# Patient Record
Sex: Female | Born: 1990 | Race: Black or African American | Hispanic: No | Marital: Single | Smoking: Never smoker
Health system: Southern US, Community
[De-identification: ages and names within clinical notes are randomized; demographics above are authoritative.]

## PROBLEM LIST (undated history)

## (undated) DIAGNOSIS — D649 Anemia, unspecified: Secondary | ICD-10-CM

## (undated) HISTORY — PX: TONSILLECTOMY: SUR1361

## (undated) HISTORY — PX: HERNIA REPAIR: SHX51

---

## 2016-09-29 ENCOUNTER — Encounter (HOSPITAL_COMMUNITY): Payer: Self-pay | Admitting: Emergency Medicine

## 2016-09-29 ENCOUNTER — Emergency Department (HOSPITAL_COMMUNITY)
Admission: EM | Admit: 2016-09-29 | Discharge: 2016-09-29 | Disposition: A | Discharge status: Home / Self Care | Payer: Self-pay | Attending: Emergency Medicine | Admitting: Emergency Medicine

## 2016-09-29 ENCOUNTER — Other Ambulatory Visit: Payer: Self-pay

## 2016-09-29 ENCOUNTER — Emergency Department (HOSPITAL_COMMUNITY): Payer: Self-pay

## 2016-09-29 DIAGNOSIS — Z9109 Other allergy status, other than to drugs and biological substances: Secondary | ICD-10-CM

## 2016-09-29 DIAGNOSIS — R0789 Other chest pain: Secondary | ICD-10-CM | POA: Insufficient documentation

## 2016-09-29 LAB — BASIC METABOLIC PANEL
ANION GAP: 8 (ref 5–15)
BUN: 10 mg/dL (ref 6–20)
CO2: 25 mmol/L (ref 22–32)
Calcium: 9.4 mg/dL (ref 8.9–10.3)
Chloride: 106 mmol/L (ref 101–111)
Creatinine, Ser: 0.7 mg/dL (ref 0.44–1.00)
GLUCOSE: 102 mg/dL — AB (ref 65–99)
POTASSIUM: 3.8 mmol/L (ref 3.5–5.1)
Sodium: 139 mmol/L (ref 135–145)

## 2016-09-29 LAB — CBC
HEMATOCRIT: 38 % (ref 36.0–46.0)
HEMOGLOBIN: 12.8 g/dL (ref 12.0–15.0)
MCH: 27.3 pg (ref 26.0–34.0)
MCHC: 33.7 g/dL (ref 30.0–36.0)
MCV: 81 fL (ref 78.0–100.0)
Platelets: 349 10*3/uL (ref 150–400)
RBC: 4.69 MIL/uL (ref 3.87–5.11)
RDW: 13 % (ref 11.5–15.5)
WBC: 7.2 10*3/uL (ref 4.0–10.5)

## 2016-09-29 LAB — POCT I-STAT TROPONIN I: Troponin i, poc: 0 ng/mL (ref 0.00–0.08)

## 2016-09-29 MED ORDER — ALBUTEROL SULFATE HFA 108 (90 BASE) MCG/ACT IN AERS: 1.0000 | INHALATION_SPRAY | Freq: Four times a day (QID) | RESPIRATORY_TRACT | 0 refills | Status: DC | PRN

## 2016-09-29 MED ORDER — ALBUTEROL SULFATE HFA 108 (90 BASE) MCG/ACT IN AERS
2.0000 | INHALATION_SPRAY | Freq: Once | RESPIRATORY_TRACT | Status: AC
  Administered 2016-09-29: 2 via RESPIRATORY_TRACT
  Filled 2016-09-29: qty 6.7

## 2016-09-29 MED ORDER — CETIRIZINE HCL 10 MG PO TABS: 10.0000 mg | ORAL_TABLET | Freq: Every day | ORAL | 0 refills | Status: DC

## 2016-09-29 NOTE — Discharge Instructions (Signed)
Continue to stay well-hydrated. Alternate between Tylenol and Ibuprofen for pain or fever. Use inhaler as directed, as needed for cough/chest congestion/wheezing/shortness of breath. Use Mucinex for cough suppression/expectoration of mucus. Use netipot and flonase to help with nasal congestion. Take the antihistamine zyrtec to decrease secretions and for help with your symptoms. Follow up with your primary care doctor in 5-7 days for recheck of ongoing symptoms. Return to emergency department for emergent changing or worsening of symptoms.

## 2016-09-29 NOTE — ED Triage Notes (Signed)
Pt from home with central cp x 1 week. Pt states it feels like a pressure. Pt states she has family hx of asthma and is worried she has the same. Pt has clear bilateral lung sounds, regular heart sounds, and strong bilateral radial pulses

## 2016-09-29 NOTE — ED Provider Notes (Signed)
[facility]-EMERGENCY DEPT Provider Note   CSN: [id number] Arrival date & time: 09/29/16  1755     History   Chief Complaint Chief Complaint  Patient presents with  . Chest Pain    HPI Carla Walker is a 26 y.o. female with no PMHx, who moved here from [place] 4 months ago, who presents to the ED with complaints of chest tightness that has been intermittent 2 weeks. She states that it's not really a pain but rather a discomfort, describing it as 7/10 intermittent nonradiating central chest tightness, with no known aggravating factors, unchanged with exertion or inspiration, and with no treatments tried or alleviating factors noted. She mentions that she's had some dental pain over the last 1 month so she has been taking NSAIDs more frequently but had not associated that with her chest tightness. She also mentions that last month she had some mild rhinorrhea and a dry cough that resolved and she didn't think anything of it. She was worried that because of her strong family history of asthma, she perhaps also had asthma and "didn't want it to sneak up on her". She just moved here from [place] 4 months ago.  She denies diaphoresis, lightheadedness, ongoing rhinorrhea, sore throat, ear pain/drainage, fevers, chills, ongoing cough, chest pain, SOB, LE swelling, recent travel/surgery/immobilization, estrogen use, personal hx of DVT/PE, abd pain, N/V/D/C, hematuria, dysuria, myalgias, arthralgias, claudication, orthopnea, numbness, tingling, focal weakness, or any other complaints at this time. She is a Non-Smoker. No known FHx of cardiac disease, however she states her mother died of a DVT that became a PE and she suddenly died when pt was 26y/o; she can't remember any specifics of the event, but states she had fallen a few months prior, no known fx, but that the next thing that happened was she died. No other family members with hx of DVT.    The history is provided by the patient and medical records. No  language interpreter was used.  Chest Pain   This is a new problem. The current episode started more than 1 week ago. The problem occurs daily. The problem has not changed since onset.The pain is associated with rest. The pain is present in the substernal region. The pain is at a severity of 7/10. The pain is mild (discomfort rather than pain, just a tightness discomfort feeling). Quality: tightness. The pain does not radiate. Duration of episode(s) is 2 weeks. Exacerbated by: nothing. Pertinent negatives include no abdominal pain, no claudication, no cough (dry cough last month, none ongoing), no diaphoresis, no fever, no leg pain, no lower extremity edema, no nausea, no numbness, no orthopnea, no shortness of breath, no vomiting and no weakness. She has tried nothing for the symptoms. The treatment provided no relief.  Her family medical history is significant for PE.  Pertinent negatives for family medical history include: no CAD and no early MI.    History reviewed. No pertinent past medical history.  There are no active problems to display for this patient.   Past Surgical History:  Procedure Laterality Date  . TONSILLECTOMY      OB History    No data available       Home Medications    Prior to Admission medications   Medication Sig Start Date End Date Taking? Authorizing Provider  ibuprofen (ADVIL,MOTRIN) 200 MG tablet Take 200-600 mg by mouth every 6 (six) hours as needed (tooth ache).   Yes [provider]  naproxen sodium (ANAPROX) 220 MG tablet Take  440 mg by mouth 2 (two) times daily as needed (toothache).   Yes [provider]    Family History No family history on file.  Social History Social History  Substance Use Topics  . Smoking status: Never Smoker  . Smokeless tobacco: Never Used  . Alcohol use Yes     Comment: occasionally      Allergies   Patient has no known allergies.   Review of Systems Review of Systems  Constitutional:  Negative for chills, diaphoresis and fever.  HENT: Negative for ear discharge, ear pain, rhinorrhea (last month, none ongoing) and sore throat.   Respiratory: Positive for chest tightness. Negative for cough (dry cough last month, none ongoing), shortness of breath and wheezing.   Cardiovascular: Negative for chest pain (tightness, but not really pain), orthopnea, claudication and leg swelling.  Gastrointestinal: Negative for abdominal pain, constipation, diarrhea, nausea and vomiting.  Genitourinary: Negative for dysuria and hematuria.  Musculoskeletal: Negative for arthralgias and myalgias.  Skin: Negative for color change.  Allergic/Immunologic: Negative for immunocompromised state.  Neurological: Negative for weakness, light-headedness and numbness.  Psychiatric/Behavioral: Negative for confusion.   All other systems reviewed and are negative for acute change except as noted in the HPI.    Physical Exam Updated Vital Signs BP 126/87 (BP Location: Right Arm)   Pulse 72   Temp 97.8 F (36.6 C) (Oral)   Resp 18   LMP 09/19/2016   SpO2 100%   Physical Exam  Constitutional: She is oriented to person, place, and time. Vital signs are normal. She appears well-developed and well-nourished.  Non-toxic appearance. No distress.  Afebrile, nontoxic, NAD  HENT:  Head: Normocephalic and atraumatic.  Mouth/Throat: Oropharynx is clear and moist and mucous membranes are normal.  Eyes: Conjunctivae and EOM are normal. Right eye exhibits no discharge. Left eye exhibits no discharge.  Neck: Normal range of motion. Neck supple.  Cardiovascular: Normal rate, regular rhythm, normal heart sounds and intact distal pulses.  Exam reveals no gallop and no friction rub.   No murmur heard. RRR, nl s1/s2, no m/r/g, distal pulses intact, no pedal edema   Pulmonary/Chest: Effort normal and breath sounds normal. No respiratory distress. She has no decreased breath sounds. She has no wheezes. She has no rhonchi.  She has no rales. She exhibits no tenderness, no crepitus, no deformity and no retraction.  CTAB in all lung fields, no w/r/r, no hypoxia or increased WOB, speaking in full sentences, SpO2 100% on RA Chest wall nonTTP without crepitus, deformities, or retractions   Abdominal: Soft. Normal appearance and bowel sounds are normal. She exhibits no distension. There is no tenderness. There is no rigidity, no rebound, no guarding, no CVA tenderness, no tenderness at McBurney's point and negative Murphy's sign.  Musculoskeletal: Normal range of motion.  MAE x4 Strength and sensation grossly intact in all extremities Distal pulses intact Gait steady No pedal edema, neg homan's bilaterally  Neurological: She is alert and oriented to person, place, and time. She has normal strength. No sensory deficit.  Skin: Skin is warm, dry and intact. No rash noted.  Psychiatric: She has a normal mood and affect.  Nursing note and vitals reviewed.    ED Treatments / Results  Labs (all labs ordered are listed, but only abnormal results are displayed) Labs Reviewed  BASIC METABOLIC PANEL - Abnormal; Notable for the following:       Result Value   Glucose, Bld 102 (*)    All other components within normal  limits  CBC  I-STAT TROPONIN, ED  POCT I-STAT TROPONIN I    EKG  EKG Interpretation None       Radiology Dg Chest 2 View  Result Date: 09/29/2016 CLINICAL DATA:  Chest pain EXAM: CHEST  2 VIEW COMPARISON:  None. FINDINGS: Lungs are clear. Heart size and pulmonary vascularity are normal. No adenopathy. No pneumothorax. No bone lesions. IMPRESSION: No abnormality noted. Electronically Signed   By: Bretta BangWilliam  Woodruff III M.D.   On: 09/29/2016 18:54    Procedures Procedures (including critical care time)  Medications Ordered in ED Medications  albuterol (PROVENTIL HFA;VENTOLIN HFA) 108 (90 Base) MCG/ACT inhaler 2 puff (not administered)     Initial Impression / Assessment and Plan / ED Course    I have reviewed the triage vital signs and the nursing notes.  Pertinent labs & imaging results that were available during my care of the patient were reviewed by me and considered in my medical decision making (see chart for details).     26 y.o. female here with chest tightness x2wks which is intermittent, states it's not really a pain but more of a discomfort. Denies SOB or any other symptoms. Mentions that last month she had a dry cough and mild rhinorrhea for a brief period of time, but that resolved. On exam, clear lungs, no tachycardia or hypoxia, no LE swelling, no reproducible chest tenderness. Highly doubt PE, dissection, ACS, etc. Labs unremarkable, CXR neg, EKG nonischemic and without acute findings. Seems like it could potentially be mild asthma/allergies, since she just moved here a few months ago, and has +FHx of asthma. Will start on zyrtec, give inhaler, advised other remedies to help with symptoms, and advised f/up with her PCP in 1wk for recheck of symptoms. I explained the diagnosis and have given explicit precautions to return to the ER including for any other new or worsening symptoms. The patient understands and accepts the medical plan as it's been dictated and I have answered their questions. Discharge instructions concerning home care and prescriptions have been given. The patient is STABLE and is discharged to home in good condition.    Final Clinical Impressions(s) / ED Diagnoses   Final diagnoses:  Chest tightness  Environmental allergies    New Prescriptions New Prescriptions   ALBUTEROL (PROVENTIL HFA;VENTOLIN HFA) 108 (90 BASE) MCG/ACT INHALER    Inhale 1-2 puffs into the lungs every 6 (six) hours as needed for wheezing or shortness of breath.   CETIRIZINE (ZYRTEC ALLERGY) 10 MG TABLET    Take 1 tablet (10 mg total) by mouth daily.     [address], LynnvilleMercedes, [place]-C 09/29/16 2228    Alvira MondaySchlossman, Erin, MD 09/30/16 2241

## 2017-03-22 ENCOUNTER — Other Ambulatory Visit: Payer: Self-pay

## 2017-03-22 ENCOUNTER — Emergency Department (HOSPITAL_COMMUNITY): Payer: Self-pay

## 2017-03-22 DIAGNOSIS — Z79899 Other long term (current) drug therapy: Secondary | ICD-10-CM | POA: Insufficient documentation

## 2017-03-22 DIAGNOSIS — R0789 Other chest pain: Secondary | ICD-10-CM | POA: Insufficient documentation

## 2017-03-22 LAB — CBC
HCT: 37.2 % (ref 36.0–46.0)
Hemoglobin: 12.6 g/dL (ref 12.0–15.0)
MCH: 27 pg (ref 26.0–34.0)
MCHC: 33.9 g/dL (ref 30.0–36.0)
MCV: 79.7 fL (ref 78.0–100.0)
PLATELETS: 364 10*3/uL (ref 150–400)
RBC: 4.67 MIL/uL (ref 3.87–5.11)
RDW: 13 % (ref 11.5–15.5)
WBC: 7.2 10*3/uL (ref 4.0–10.5)

## 2017-03-22 LAB — BASIC METABOLIC PANEL
Anion gap: 7 (ref 5–15)
BUN: 9 mg/dL (ref 6–20)
CALCIUM: 9.3 mg/dL (ref 8.9–10.3)
CHLORIDE: 106 mmol/L (ref 101–111)
CO2: 24 mmol/L (ref 22–32)
CREATININE: 0.61 mg/dL (ref 0.44–1.00)
Glucose, Bld: 111 mg/dL — ABNORMAL HIGH (ref 65–99)
Potassium: 3.8 mmol/L (ref 3.5–5.1)
SODIUM: 137 mmol/L (ref 135–145)

## 2017-03-22 LAB — I-STAT TROPONIN, ED: TROPONIN I, POC: 0 ng/mL (ref 0.00–0.08)

## 2017-03-22 LAB — I-STAT BETA HCG BLOOD, ED (MC, WL, AP ONLY)

## 2017-03-22 NOTE — ED Triage Notes (Signed)
Pt reports intermittent upper cp x 1 week with intermittent numbness in her arms and legs bilaterally.  She reports she's been seen in the past for the cp with unknown cause.  She states she was given an inhaler, but she is not having any diff breathing.  Pt is A&Ox 4.

## 2017-03-23 ENCOUNTER — Emergency Department (HOSPITAL_COMMUNITY)
Admission: EM | Admit: 2017-03-23 | Discharge: 2017-03-23 | Disposition: A | Discharge status: Home / Self Care | Payer: Self-pay | Attending: Emergency Medicine | Admitting: Emergency Medicine

## 2017-03-23 DIAGNOSIS — R0789 Other chest pain: Secondary | ICD-10-CM

## 2017-03-23 MED ORDER — METHOCARBAMOL 500 MG PO TABS: 500.0000 mg | ORAL_TABLET | Freq: Two times a day (BID) | ORAL | 0 refills | Status: DC

## 2017-03-23 MED ORDER — DICLOFENAC SODIUM 75 MG PO TBEC: 75.0000 mg | DELAYED_RELEASE_TABLET | Freq: Two times a day (BID) | ORAL | 0 refills | Status: DC

## 2017-03-23 NOTE — ED Provider Notes (Signed)
[facility]-EMERGENCY DEPT Provider Note   CSN: [id number] Arrival date & time: 03/22/17  1928     History   Chief Complaint Chief Complaint  Patient presents with  . Chest Pain  . Numbness    HPI Carla Walker is a 27 y.o. female.  The history is provided by the patient. No language interpreter was used.  Chest Pain   This is a new problem. The current episode started more than 1 week ago. The problem occurs constantly. The problem has been gradually worsening. The pain is associated with movement. The pain is present in the substernal region. The pain is moderate. The quality of the pain is described as brief. The pain does not radiate. She has tried nothing for the symptoms. The treatment provided no relief. There are no known risk factors.  Pertinent negatives for family medical history include: no early MI.    No past medical history on file.  There are no active problems to display for this patient.   Past Surgical History:  Procedure Laterality Date  . TONSILLECTOMY      OB History    No data available       Home Medications    Prior to Admission medications   Medication Sig Start Date End Date Taking? Authorizing Provider  albuterol (PROVENTIL HFA;VENTOLIN HFA) 108 (90 Base) MCG/ACT inhaler Inhale 1-2 puffs into the lungs every 6 (six) hours as needed for wheezing or shortness of breath. 09/29/16   Street, [place], PA-C  cetirizine (ZYRTEC ALLERGY) 10 MG tablet Take 1 tablet (10 mg total) by mouth daily. 09/29/16   Street, [place], PA-C  diclofenac (VOLTAREN) 75 MG EC tablet Take 1 tablet (75 mg total) by mouth 2 (two) times daily. 03/23/17   Elson Areas, PA-C  ibuprofen (ADVIL,MOTRIN) 200 MG tablet Take 200-600 mg by mouth every 6 (six) hours as needed (tooth ache).    [provider]  methocarbamol (ROBAXIN) 500 MG tablet Take 1 tablet (500 mg total) by mouth 2 (two) times daily. 03/23/17   Elson Areas, PA-C  naproxen  sodium (ANAPROX) 220 MG tablet Take 440 mg by mouth 2 (two) times daily as needed (toothache).    [provider]    Family History No family history on file.  Social History Social History   Tobacco Use  . Smoking status: Never Smoker  . Smokeless tobacco: Never Used  Substance Use Topics  . Alcohol use: Yes    Comment: occasionally   . Drug use: No     Allergies   Patient has no known allergies.   Review of Systems Review of Systems  Cardiovascular: Positive for chest pain.  All other systems reviewed and are negative.    Physical Exam Updated Vital Signs BP 100/67 (BP Location: Left Arm)   Pulse 72   Temp 98.4 F (36.9 C) (Oral)   Resp 20   Ht 5\' 7"  (1.702 m)   Wt 100.7 kg (222 lb)   LMP 03/02/2017   SpO2 100%   BMI 34.77 kg/m   Physical Exam  Constitutional: She appears well-developed and well-nourished. No distress.  HENT:  Head: Normocephalic and atraumatic.  Eyes: Conjunctivae are normal.  Neck: Neck supple.  Cardiovascular: Normal rate, regular rhythm and normal pulses.  No murmur heard. Pulmonary/Chest: Effort normal and breath sounds normal. No respiratory distress. She has no decreased breath sounds.  Abdominal: Soft. There is no tenderness.  Musculoskeletal: She exhibits no edema.  Neurological: She is  alert.  Skin: Skin is warm and dry.  Psychiatric: She has a normal mood and affect.  Nursing note and vitals reviewed.    ED Treatments / Results  Labs (all labs ordered are listed, but only abnormal results are displayed) Labs Reviewed  BASIC METABOLIC PANEL - Abnormal; Notable for the following components:      Result Value   Glucose, Bld 111 (*)    All other components within normal limits  CBC  I-STAT TROPONIN, ED  I-STAT BETA HCG BLOOD, ED ([facility], [facility], [facility] ONLY)    EKG  EKG Interpretation None       Radiology Dg Chest 2 View  Result Date: 03/22/2017 CLINICAL DATA:  Intermittent chest pain for 1 week EXAM: CHEST  2  VIEW COMPARISON:  09/29/2016 FINDINGS: Normal heart size and mediastinal contours. No acute infiltrate or edema. No effusion or pneumothorax. No acute osseous findings. IMPRESSION: Negative chest. Electronically Signed   By: Marnee SpringJonathon  Watts M.D.   On: 03/22/2017 20:42    Procedures Procedures (including critical care time)  Medications Ordered in ED Medications - No data to display   Initial Impression / Assessment and Plan / ED Course  I have reviewed the triage vital signs and the nursing notes.  Pertinent labs & imaging results that were available during my care of the patient were reviewed by me and considered in my medical decision making (see chart for details).     EKG normal, chest xray normal, labs reviewed.   Final Clinical Impressions(s) / ED Diagnoses   Final diagnoses:  Chest wall pain    ED Discharge Orders        Ordered    methocarbamol (ROBAXIN) 500 MG tablet  2 times daily     03/23/17 0910    diclofenac (VOLTAREN) 75 MG EC tablet  2 times daily     03/23/17 0910    An After Visit Summary was printed and given to the patient.    Elson AreasSofia, Raizel Wesolowski K, [place]PA-C 03/23/17 1125    Derwood KaplanNanavati, Ankit, MD 03/23/17 1719

## 2017-03-23 NOTE — Discharge Instructions (Signed)
Return if any problems.

## 2017-03-23 NOTE — ED Notes (Signed)
PA student at bedside.

## 2017-03-23 NOTE — ED Notes (Signed)
ED Provider at bedside. 

## 2017-10-04 LAB — OB RESULTS CONSOLE ANTIBODY SCREEN: Antibody Screen: NEGATIVE

## 2017-10-04 LAB — OB RESULTS CONSOLE GC/CHLAMYDIA
Chlamydia: NEGATIVE
Gonorrhea: NEGATIVE

## 2017-10-04 LAB — OB RESULTS CONSOLE ABO/RH: RH Type: POSITIVE

## 2017-10-04 LAB — OB RESULTS CONSOLE RPR: RPR: NONREACTIVE

## 2017-10-04 LAB — OB RESULTS CONSOLE HIV ANTIBODY (ROUTINE TESTING): HIV: NONREACTIVE

## 2017-10-04 LAB — OB RESULTS CONSOLE HEPATITIS B SURFACE ANTIGEN: Hepatitis B Surface Ag: NEGATIVE

## 2017-10-04 LAB — OB RESULTS CONSOLE RUBELLA ANTIBODY, IGM: RUBELLA: IMMUNE

## 2018-01-06 ENCOUNTER — Other Ambulatory Visit: Payer: Self-pay | Admitting: Obstetrics and Gynecology

## 2018-02-08 ENCOUNTER — Encounter (HOSPITAL_COMMUNITY): Payer: Self-pay | Admitting: *Deleted

## 2018-02-08 ENCOUNTER — Inpatient Hospital Stay (HOSPITAL_COMMUNITY)
Admission: AD | Admit: 2018-02-08 | Discharge: 2018-02-08 | Disposition: A | Discharge status: Home / Self Care | Payer: Medicaid Other | Source: Ambulatory Visit | Attending: Obstetrics and Gynecology | Admitting: Obstetrics and Gynecology

## 2018-02-08 ENCOUNTER — Other Ambulatory Visit: Payer: Self-pay

## 2018-02-08 DIAGNOSIS — O479 False labor, unspecified: Secondary | ICD-10-CM

## 2018-02-08 DIAGNOSIS — Z79899 Other long term (current) drug therapy: Secondary | ICD-10-CM | POA: Insufficient documentation

## 2018-02-08 DIAGNOSIS — Z3A35 35 weeks gestation of pregnancy: Secondary | ICD-10-CM | POA: Insufficient documentation

## 2018-02-08 DIAGNOSIS — O26893 Other specified pregnancy related conditions, third trimester: Secondary | ICD-10-CM | POA: Diagnosis not present

## 2018-02-08 DIAGNOSIS — O4703 False labor before 37 completed weeks of gestation, third trimester: Secondary | ICD-10-CM | POA: Diagnosis not present

## 2018-02-08 DIAGNOSIS — O4700 False labor before 37 completed weeks of gestation, unspecified trimester: Secondary | ICD-10-CM

## 2018-02-08 LAB — URINALYSIS, ROUTINE W REFLEX MICROSCOPIC
BILIRUBIN URINE: NEGATIVE
GLUCOSE, UA: NEGATIVE mg/dL
HGB URINE DIPSTICK: NEGATIVE
Ketones, ur: NEGATIVE mg/dL
Leukocytes, UA: NEGATIVE
Nitrite: NEGATIVE
Protein, ur: NEGATIVE mg/dL
SPECIFIC GRAVITY, URINE: 1.014 (ref 1.005–1.030)
pH: 6 (ref 5.0–8.0)

## 2018-02-08 MED ORDER — NIFEDIPINE 10 MG PO CAPS
10.0000 mg | ORAL_CAPSULE | ORAL | Status: AC | PRN
  Administered 2018-02-08 (×3): 10 mg via ORAL
  Filled 2018-02-08 (×3): qty 1

## 2018-02-08 MED ORDER — LACTATED RINGERS IV BOLUS
1000.0000 mL | Freq: Once | INTRAVENOUS | Status: AC
  Administered 2018-02-08: 1000 mL via INTRAVENOUS

## 2018-02-08 MED ORDER — TERBUTALINE SULFATE 1 MG/ML IJ SOLN
0.2500 mg | Freq: Once | INTRAMUSCULAR | Status: AC
  Administered 2018-02-08: 0.25 mg via SUBCUTANEOUS
  Filled 2018-02-08: qty 1

## 2018-02-08 MED ORDER — NIFEDIPINE 10 MG PO CAPS: 10.0000 mg | ORAL_CAPSULE | Freq: Four times a day (QID) | ORAL | 0 refills | Status: DC | PRN

## 2018-02-08 MED ORDER — ACETAMINOPHEN 500 MG PO TABS
1000.0000 mg | ORAL_TABLET | Freq: Once | ORAL | Status: AC
  Administered 2018-02-08: 1000 mg via ORAL
  Filled 2018-02-08: qty 2

## 2018-02-08 NOTE — Discharge Instructions (Signed)
Braxton Hicks Contractions °Contractions of the uterus can occur throughout pregnancy, but they are not always a sign that you are in labor. You may have practice contractions called Braxton Hicks contractions. These false labor contractions are sometimes confused with true labor. °What are Braxton Hicks contractions? °Braxton Hicks contractions are tightening movements that occur in the muscles of the uterus before labor. Unlike true labor contractions, these contractions do not result in opening (dilation) and thinning of the cervix. Toward the end of pregnancy (32-34 weeks), Braxton Hicks contractions can happen more often and may become stronger. These contractions are sometimes difficult to tell apart from true labor because they can be very uncomfortable. You should not feel embarrassed if you go to the hospital with false labor. °Sometimes, the only way to tell if you are in true labor is for your health care provider to look for changes in the cervix. The health care provider will do a physical exam and may monitor your contractions. If you are not in true labor, the exam should show that your cervix is not dilating and your water has not broken. °If there are other health problems associated with your pregnancy, it is completely safe for you to be sent home with false labor. You may continue to have Braxton Hicks contractions until you go into true labor. °How to tell the difference between true labor and false labor °True labor °· Contractions last 30-70 seconds. °· Contractions become very regular. °· Discomfort is usually felt in the top of the uterus, and it spreads to the lower abdomen and low back. °· Contractions do not go away with walking. °· Contractions usually become more intense and increase in frequency. °· The cervix dilates and gets thinner. °False labor °· Contractions are usually shorter and not as strong as true labor contractions. °· Contractions are usually irregular. °· Contractions  are often felt in the front of the lower abdomen and in the groin. °· Contractions may go away when you walk around or change positions while lying down. °· Contractions get weaker and are shorter-lasting as time goes on. °· The cervix usually does not dilate or become thin. °Follow these instructions at home: °· Take over-the-counter and prescription medicines only as told by your health care provider. °· Keep up with your usual exercises and follow other instructions from your health care provider. °· Eat and drink lightly if you think you are going into labor. °· If Braxton Hicks contractions are making you uncomfortable: °? Change your position from lying down or resting to walking, or change from walking to resting. °? Sit and rest in a tub of warm water. °? Drink enough fluid to keep your urine pale yellow. Dehydration may cause these contractions. °? Do slow and deep breathing several times an hour. °· Keep all follow-up prenatal visits as told by your health care provider. This is important. °Contact a health care provider if: °· You have a fever. °· You have continuous pain in your abdomen. °Get help right away if: °· Your contractions become stronger, more regular, and closer together. °· You have fluid leaking or gushing from your vagina. °· You pass blood-tinged mucus (bloody show). °· You have bleeding from your vagina. °· You have low back pain that you never had before. °· You feel your baby’s head pushing down and causing pelvic pressure. °· Your baby is not moving inside you as much as it used to. °Summary °· Contractions that occur before labor are called Braxton   Hicks contractions, false labor, or practice contractions. °· Braxton Hicks contractions are usually shorter, weaker, farther apart, and less regular than true labor contractions. True labor contractions usually become progressively stronger and regular and they become more frequent. °· Manage discomfort from Braxton Hicks contractions by  changing position, resting in a warm bath, drinking plenty of water, or practicing deep breathing. °This information is not intended to replace advice given to you by your health care provider. Make sure you discuss any questions you have with your health care provider. °Document Released: 07/15/2016 Document Revised: 07/15/2016 Document Reviewed: 07/15/2016 °Elsevier Interactive Patient Education © 2018 [employer] ° °

## 2018-02-08 NOTE — MAU Provider Note (Signed)
History     CSN: [id number]  Arrival date and time: 02/08/18 1356   First Provider Initiated Contact with Patient 02/08/18 1427      Chief Complaint  Patient presents with  . Abdominal Pain  . Back Pain   HPI Carla Walker is a 27 y.o. [id number] at 6443w2d who presents with contractions that started at 0600. She rates them a 5/10 and states they are gradually getting worse. She states they are every few minutes but is not timing them. She denies any leaking or bleeding. Reports normal fetal movement.   OB History    Gravida  5   Para  2   Term  2   Preterm      AB  2   Living  2     SAB      TAB  2   Ectopic      Multiple      Live Births  2           No past medical history on file.  Past Surgical History:  Procedure Laterality Date  . TONSILLECTOMY      No family history on file.  Social History   Tobacco Use  . Smoking status: Never Smoker  . Smokeless tobacco: Never Used  Substance Use Topics  . Alcohol use: Yes    Comment: occasionally   . Drug use: No    Allergies: No Known Allergies  Medications Prior to Admission  Medication Sig Dispense Refill Last Dose  . albuterol (PROVENTIL HFA;VENTOLIN HFA) 108 (90 Base) MCG/ACT inhaler Inhale 1-2 puffs into the lungs every 6 (six) hours as needed for wheezing or shortness of breath. 1 Inhaler 0   . cetirizine (ZYRTEC ALLERGY) 10 MG tablet Take 1 tablet (10 mg total) by mouth daily. 30 tablet 0   . diclofenac (VOLTAREN) 75 MG EC tablet Take 1 tablet (75 mg total) by mouth 2 (two) times daily. 20 tablet 0   . ibuprofen (ADVIL,MOTRIN) 200 MG tablet Take 200-600 mg by mouth every 6 (six) hours as needed (tooth ache).   Past Week at Unknown time  . methocarbamol (ROBAXIN) 500 MG tablet Take 1 tablet (500 mg total) by mouth 2 (two) times daily. 20 tablet 0   . naproxen sodium (ANAPROX) 220 MG tablet Take 440 mg by mouth 2 (two) times daily as needed (toothache).   Past Week at Unknown time     Review of Systems  Constitutional: Negative.  Negative for fatigue and fever.  HENT: Negative.   Respiratory: Negative.  Negative for shortness of breath.   Cardiovascular: Negative.  Negative for chest pain.  Gastrointestinal: Positive for abdominal pain. Negative for constipation, diarrhea, nausea and vomiting.  Genitourinary: Negative.  Negative for dysuria, vaginal bleeding and vaginal discharge.  Musculoskeletal: Positive for back pain.  Neurological: Negative.  Negative for dizziness and headaches.   Physical Exam   Blood pressure 129/75, pulse 98, temperature 98.5 F (36.9 C), temperature source Oral, resp. rate 18, height 5\' 7"  (1.702 m), weight 111.1 kg, last menstrual period 03/02/2017, SpO2 100 %.  Physical Exam  Nursing note and vitals reviewed. Constitutional: She is oriented to person, place, and time. She appears well-developed and well-nourished. No distress.  HENT:  Head: Normocephalic.  Eyes: Pupils are equal, round, and reactive to light.  Cardiovascular: Normal rate, regular rhythm and normal heart sounds.  Respiratory: Effort normal and breath sounds normal. No respiratory distress.  GI: Soft. Bowel sounds are normal. She exhibits no  distension. There is no tenderness.  Neurological: She is alert and oriented to person, place, and time.  Skin: Skin is warm and dry.  Psychiatric: She has a normal mood and affect. Her behavior is normal. Judgment and thought content normal.   Dilation: Closed Effacement (%): Thick Cervical Position: Posterior Exam by:: C Neill CNM  Fetal Tracing:  Baseline: 150 Variability: moderate Accels: 15x15 Decels: none  Toco: occasional uc's  MAU Course  Procedures Results for orders placed or performed during the hospital encounter of 02/08/18 (from the past 24 hour(s))  Urinalysis, Routine w reflex microscopic     Status: Abnormal   Collection Time: 02/08/18  2:18 PM  Result Value Ref Range   Color, Urine YELLOW YELLOW    APPearance CLOUDY (A) CLEAR   Specific Gravity, Urine 1.014 1.005 - 1.030   pH 6.0 5.0 - 8.0   Glucose, UA NEGATIVE NEGATIVE mg/dL   Hgb urine dipstick NEGATIVE NEGATIVE   Bilirubin Urine NEGATIVE NEGATIVE   Ketones, ur NEGATIVE NEGATIVE mg/dL   Protein, ur NEGATIVE NEGATIVE mg/dL   Nitrite NEGATIVE NEGATIVE   Leukocytes, UA NEGATIVE NEGATIVE   MDM UA Initially contracting every 4-5 minutes LR bolus  Contractions resolved after fluids and patient was able to sleep but the contractions returned. No change in cervix after 1.5 hours  LR infusion Procardia 10mg  PO q39min x3 doses  Patient reports the contractions do not feel as strong but continues to have contractions every 10 minutes  Consulted with Dr. Emelda Fear- will give patient terbutaline and MD to come see patient.   Dr. Emelda Fear at bedside and performed full physical exam. Uterus non tender, patient in no distress and reports contractions are better. Plan to discharge patient home with procardia and follow up in the office as scheduled. Patient agreeable to plan of care.  Assessment and Plan   1. Preterm contractions   2. [redacted] weeks gestation of pregnancy    -Discharge home in stable condition -Rx for procardia given to patient -Preterm labor precautions discussed -Patient advised to follow-up with [facility] as scheduled for prenatal care -Patient may return to MAU as needed or if her condition were to change or worsen  Rolm Bookbinder CNM 02/08/2018, 5:22 PM

## 2018-02-08 NOTE — MAU Note (Signed)
Woke up this morning with cramps, getting worse.  They are in the back and in her stomach.

## 2018-02-22 ENCOUNTER — Encounter (HOSPITAL_COMMUNITY): Payer: Self-pay

## 2018-03-06 ENCOUNTER — Encounter (HOSPITAL_COMMUNITY)
Admission: RE | Admit: 2018-03-06 | Discharge: 2018-03-06 | Disposition: A | Discharge status: Home / Self Care | Payer: Medicaid Other | Source: Ambulatory Visit | Attending: Obstetrics and Gynecology | Admitting: Obstetrics and Gynecology

## 2018-03-06 HISTORY — DX: Anemia, unspecified: D64.9

## 2018-03-06 LAB — TYPE AND SCREEN
ABO/RH(D): O POS
Antibody Screen: NEGATIVE

## 2018-03-06 LAB — CBC
HCT: 32.5 % — ABNORMAL LOW (ref 36.0–46.0)
Hemoglobin: 10.5 g/dL — ABNORMAL LOW (ref 12.0–15.0)
MCH: 24.8 pg — ABNORMAL LOW (ref 26.0–34.0)
MCHC: 32.3 g/dL (ref 30.0–36.0)
MCV: 76.8 fL — ABNORMAL LOW (ref 80.0–100.0)
Platelets: 296 10*3/uL (ref 150–400)
RBC: 4.23 MIL/uL (ref 3.87–5.11)
RDW: 15.9 % — AB (ref 11.5–15.5)
WBC: 7.3 10*3/uL (ref 4.0–10.5)
nRBC: 0 % (ref 0.0–0.2)

## 2018-03-06 LAB — ABO/RH: ABO/RH(D): O POS

## 2018-03-06 NOTE — Patient Instructions (Signed)
Carla Walker  03/06/2018   Your procedure is scheduled on:  03/09/2018  [facility].  Pick up the phone at the desk and dial [phone]  Call this number if you have problems the morning of surgery:[phone]  Remember:   Do not eat food:(After Midnight) Desps de medianoche.  Do not drink clear liquids: (After Midnight) Desps de medianoche.  Take these medicines the morning of surgery with A SIP OF WATER: none   Do not wear jewelry, make-up or nail polish.  Do not wear lotions, powders, or perfumes. Do not wear deodorant.  Do not shave 48 hours prior to surgery.  Do not bring valuables to the hospital.  [facility] is not   responsible for any belongings or valuables brought to the hospital.  Contacts, dentures or bridgework may not be worn into surgery.  Leave suitcase in the car. After surgery it may be brought to your room.  For patients admitted to the hospital, checkout time is 11:00 AM the day of              discharge.    N/A   Please read over the following fact sheets that you were given:   Surgical Site Infection Prevention

## 2018-03-07 LAB — RPR: RPR Ser Ql: NONREACTIVE

## 2018-03-09 ENCOUNTER — Inpatient Hospital Stay (HOSPITAL_COMMUNITY)
Admission: RE | Admit: 2018-03-09 | Discharge: 2018-03-13 | DRG: 785 | Disposition: A | Discharge status: Home / Self Care | Payer: Medicaid Other | Attending: Obstetrics and Gynecology | Admitting: Obstetrics and Gynecology

## 2018-03-09 ENCOUNTER — Inpatient Hospital Stay (HOSPITAL_COMMUNITY): Payer: Medicaid Other | Admitting: Anesthesiology

## 2018-03-09 ENCOUNTER — Encounter (HOSPITAL_COMMUNITY): Payer: Self-pay | Admitting: *Deleted

## 2018-03-09 ENCOUNTER — Encounter (HOSPITAL_COMMUNITY)
Admission: RE | Disposition: A | Discharge status: Home / Self Care | Payer: Self-pay | Source: Home / Self Care | Attending: Obstetrics and Gynecology

## 2018-03-09 DIAGNOSIS — O34211 Maternal care for low transverse scar from previous cesarean delivery: Principal | ICD-10-CM | POA: Diagnosis present

## 2018-03-09 DIAGNOSIS — Z3A39 39 weeks gestation of pregnancy: Secondary | ICD-10-CM

## 2018-03-09 DIAGNOSIS — O99214 Obesity complicating childbirth: Secondary | ICD-10-CM | POA: Diagnosis present

## 2018-03-09 DIAGNOSIS — Z302 Encounter for sterilization: Secondary | ICD-10-CM | POA: Diagnosis not present

## 2018-03-09 SURGERY — Surgical Case
Anesthesia: Spinal | Site: Abdomen | Wound class: Clean Contaminated

## 2018-03-09 MED ORDER — SODIUM CHLORIDE 0.9% FLUSH: 3.0000 mL | INTRAVENOUS | Status: DC | PRN

## 2018-03-09 MED ORDER — SCOPOLAMINE 1 MG/3DAYS TD PT72
MEDICATED_PATCH | TRANSDERMAL | Status: AC
  Administered 2018-03-09: 1.5 mg
  Filled 2018-03-09: qty 1

## 2018-03-09 MED ORDER — DIPHENHYDRAMINE HCL 25 MG PO CAPS
25.0000 mg | ORAL_CAPSULE | ORAL | Status: DC | PRN
  Filled 2018-03-09: qty 1

## 2018-03-09 MED ORDER — NALBUPHINE HCL 10 MG/ML IJ SOLN: 5.0000 mg | Freq: Once | INTRAMUSCULAR | Status: DC | PRN

## 2018-03-09 MED ORDER — COCONUT OIL OIL
1.0000 "application " | TOPICAL_OIL | Status: DC | PRN
  Administered 2018-03-13: 1 via TOPICAL
  Filled 2018-03-09: qty 120

## 2018-03-09 MED ORDER — LACTATED RINGERS IV SOLN
INTRAVENOUS | Status: DC
  Administered 2018-03-09: 21:00:00 via INTRAVENOUS

## 2018-03-09 MED ORDER — SIMETHICONE 80 MG PO CHEW
80.0000 mg | CHEWABLE_TABLET | Freq: Three times a day (TID) | ORAL | Status: DC
  Administered 2018-03-09 – 2018-03-13 (×10): 80 mg via ORAL
  Filled 2018-03-09 (×12): qty 1

## 2018-03-09 MED ORDER — OXYTOCIN 10 UNIT/ML IJ SOLN
INTRAMUSCULAR | Status: AC
  Filled 2018-03-09: qty 4

## 2018-03-09 MED ORDER — OXYTOCIN 10 UNIT/ML IJ SOLN
INTRAVENOUS | Status: DC | PRN
  Administered 2018-03-09: 40 [IU] via INTRAVENOUS

## 2018-03-09 MED ORDER — OXYCODONE HCL 5 MG/5ML PO SOLN: 5.0000 mg | Freq: Once | ORAL | Status: DC | PRN

## 2018-03-09 MED ORDER — SODIUM CHLORIDE 0.9 % IR SOLN
Status: DC | PRN
  Administered 2018-03-09: 1000 mL

## 2018-03-09 MED ORDER — DIBUCAINE 1 % RE OINT: 1.0000 "application " | TOPICAL_OINTMENT | RECTAL | Status: DC | PRN

## 2018-03-09 MED ORDER — WITCH HAZEL-GLYCERIN EX PADS: 1.0000 "application " | MEDICATED_PAD | CUTANEOUS | Status: DC | PRN

## 2018-03-09 MED ORDER — NALOXONE HCL 4 MG/10ML IJ SOLN
1.0000 ug/kg/h | INTRAVENOUS | Status: DC | PRN
  Filled 2018-03-09: qty 5

## 2018-03-09 MED ORDER — ONDANSETRON HCL 4 MG/2ML IJ SOLN
4.0000 mg | Freq: Three times a day (TID) | INTRAMUSCULAR | Status: DC | PRN
  Administered 2018-03-09: 4 mg via INTRAVENOUS
  Filled 2018-03-09: qty 2

## 2018-03-09 MED ORDER — PRENATAL MULTIVITAMIN CH
1.0000 | ORAL_TABLET | Freq: Every day | ORAL | Status: DC
  Administered 2018-03-10 – 2018-03-13 (×4): 1 via ORAL
  Filled 2018-03-09 (×5): qty 1

## 2018-03-09 MED ORDER — MENTHOL 3 MG MT LOZG: 1.0000 | LOZENGE | OROMUCOSAL | Status: DC | PRN

## 2018-03-09 MED ORDER — MORPHINE SULFATE (PF) 0.5 MG/ML IJ SOLN
INTRAMUSCULAR | Status: DC | PRN
  Administered 2018-03-09: .15 mg via INTRATHECAL

## 2018-03-09 MED ORDER — NALBUPHINE HCL 10 MG/ML IJ SOLN: 5.0000 mg | INTRAMUSCULAR | Status: DC | PRN

## 2018-03-09 MED ORDER — ONDANSETRON HCL 4 MG/2ML IJ SOLN
INTRAMUSCULAR | Status: AC
  Filled 2018-03-09: qty 2

## 2018-03-09 MED ORDER — OXYCODONE HCL 5 MG PO TABS: 5.0000 mg | ORAL_TABLET | Freq: Once | ORAL | Status: DC | PRN

## 2018-03-09 MED ORDER — SIMETHICONE 80 MG PO CHEW: 80.0000 mg | CHEWABLE_TABLET | ORAL | Status: DC | PRN

## 2018-03-09 MED ORDER — PHENYLEPHRINE HCL 10 MG/ML IJ SOLN: INTRAMUSCULAR | Status: DC | PRN

## 2018-03-09 MED ORDER — ACETAMINOPHEN 325 MG PO TABS
650.0000 mg | ORAL_TABLET | ORAL | Status: DC | PRN
  Administered 2018-03-09: 650 mg via ORAL
  Filled 2018-03-09: qty 2

## 2018-03-09 MED ORDER — STERILE WATER FOR IRRIGATION IR SOLN
Status: DC | PRN
  Administered 2018-03-09: 1000 mL

## 2018-03-09 MED ORDER — SIMETHICONE 80 MG PO CHEW
80.0000 mg | CHEWABLE_TABLET | ORAL | Status: DC
  Administered 2018-03-09 – 2018-03-12 (×4): 80 mg via ORAL
  Filled 2018-03-09 (×4): qty 1

## 2018-03-09 MED ORDER — FENTANYL CITRATE (PF) 100 MCG/2ML IJ SOLN: 25.0000 ug | INTRAMUSCULAR | Status: DC | PRN

## 2018-03-09 MED ORDER — ZOLPIDEM TARTRATE 5 MG PO TABS: 5.0000 mg | ORAL_TABLET | Freq: Every evening | ORAL | Status: DC | PRN

## 2018-03-09 MED ORDER — NALOXONE HCL 0.4 MG/ML IJ SOLN: 0.4000 mg | INTRAMUSCULAR | Status: DC | PRN

## 2018-03-09 MED ORDER — OXYTOCIN 40 UNITS IN LACTATED RINGERS INFUSION - SIMPLE MED: 2.5000 [IU]/h | INTRAVENOUS | Status: AC

## 2018-03-09 MED ORDER — PHENYLEPHRINE 8 MG IN D5W 100 ML (0.08MG/ML) PREMIX OPTIME
INJECTION | INTRAVENOUS | Status: DC | PRN
  Administered 2018-03-09: 60 ug/min via INTRAVENOUS

## 2018-03-09 MED ORDER — FENTANYL CITRATE (PF) 100 MCG/2ML IJ SOLN
INTRAMUSCULAR | Status: AC
  Filled 2018-03-09: qty 2

## 2018-03-09 MED ORDER — SCOPOLAMINE 1 MG/3DAYS TD PT72
1.0000 | MEDICATED_PATCH | Freq: Once | TRANSDERMAL | Status: AC
  Administered 2018-03-09: 1.5 mg via TRANSDERMAL
  Filled 2018-03-09: qty 1

## 2018-03-09 MED ORDER — TETANUS-DIPHTH-ACELL PERTUSSIS 5-2.5-18.5 LF-MCG/0.5 IM SUSP: 0.5000 mL | Freq: Once | INTRAMUSCULAR | Status: DC

## 2018-03-09 MED ORDER — OXYCODONE-ACETAMINOPHEN 5-325 MG PO TABS
1.0000 | ORAL_TABLET | ORAL | Status: DC | PRN
  Administered 2018-03-10 (×2): 2 via ORAL
  Filled 2018-03-09 (×2): qty 2

## 2018-03-09 MED ORDER — MEPERIDINE HCL 25 MG/ML IJ SOLN: 6.2500 mg | INTRAMUSCULAR | Status: DC | PRN

## 2018-03-09 MED ORDER — ONDANSETRON HCL 4 MG/2ML IJ SOLN
INTRAMUSCULAR | Status: DC | PRN
  Administered 2018-03-09: 4 mg via INTRAVENOUS

## 2018-03-09 MED ORDER — BUPIVACAINE IN DEXTROSE 0.75-8.25 % IT SOLN
INTRATHECAL | Status: DC | PRN
  Administered 2018-03-09: 13.5 mg via INTRATHECAL

## 2018-03-09 MED ORDER — LACTATED RINGERS IV SOLN
INTRAVENOUS | Status: DC
  Administered 2018-03-09 (×2): via INTRAVENOUS

## 2018-03-09 MED ORDER — LACTATED RINGERS IV SOLN
INTRAVENOUS | Status: DC | PRN
  Administered 2018-03-09: 12:00:00 via INTRAVENOUS

## 2018-03-09 MED ORDER — ACETAMINOPHEN 10 MG/ML IV SOLN
1000.0000 mg | Freq: Once | INTRAVENOUS | Status: AC
  Administered 2018-03-09: 1000 mg via INTRAVENOUS
  Filled 2018-03-09: qty 100

## 2018-03-09 MED ORDER — FENTANYL CITRATE (PF) 100 MCG/2ML IJ SOLN
INTRAMUSCULAR | Status: DC | PRN
  Administered 2018-03-09: 15 ug via INTRATHECAL

## 2018-03-09 MED ORDER — PROMETHAZINE HCL 25 MG/ML IJ SOLN
6.2500 mg | Freq: Once | INTRAMUSCULAR | Status: AC
  Administered 2018-03-09: 6.25 mg via INTRAVENOUS
  Filled 2018-03-09: qty 1

## 2018-03-09 MED ORDER — ONDANSETRON HCL 4 MG/2ML IJ SOLN: 4.0000 mg | Freq: Once | INTRAMUSCULAR | Status: DC | PRN

## 2018-03-09 MED ORDER — SENNOSIDES-DOCUSATE SODIUM 8.6-50 MG PO TABS
2.0000 | ORAL_TABLET | ORAL | Status: DC
  Administered 2018-03-09 – 2018-03-12 (×4): 2 via ORAL
  Filled 2018-03-09 (×4): qty 2

## 2018-03-09 MED ORDER — PHENYLEPHRINE 8 MG IN D5W 100 ML (0.08MG/ML) PREMIX OPTIME
INJECTION | INTRAVENOUS | Status: AC
  Filled 2018-03-09: qty 100

## 2018-03-09 MED ORDER — MORPHINE SULFATE (PF) 0.5 MG/ML IJ SOLN
INTRAMUSCULAR | Status: AC
  Filled 2018-03-09: qty 10

## 2018-03-09 MED ORDER — DIPHENHYDRAMINE HCL 25 MG PO CAPS
25.0000 mg | ORAL_CAPSULE | Freq: Four times a day (QID) | ORAL | Status: DC | PRN
  Administered 2018-03-09 – 2018-03-10 (×2): 25 mg via ORAL
  Filled 2018-03-09 (×2): qty 1

## 2018-03-09 MED ORDER — DIPHENHYDRAMINE HCL 50 MG/ML IJ SOLN: 12.5000 mg | INTRAMUSCULAR | Status: DC | PRN

## 2018-03-09 MED ORDER — CEFAZOLIN SODIUM-DEXTROSE 2-4 GM/100ML-% IV SOLN
2.0000 g | INTRAVENOUS | Status: AC
  Administered 2018-03-09: 2 g via INTRAVENOUS
  Filled 2018-03-09: qty 100

## 2018-03-09 SURGICAL SUPPLY — 28 items
BENZOIN TINCTURE PRP APPL 2/3 (GAUZE/BANDAGES/DRESSINGS) ×3 IMPLANT
CLAMP CORD UMBIL (MISCELLANEOUS) ×3 IMPLANT
CLIP FILSHIE TUBAL LIGA STRL (Clip) ×3 IMPLANT
CLOSURE WOUND 1/2 X4 (GAUZE/BANDAGES/DRESSINGS) ×1
CLOTH BEACON ORANGE TIMEOUT ST (SAFETY) ×3 IMPLANT
DRSG OPSITE POSTOP 4X10 (GAUZE/BANDAGES/DRESSINGS) ×3 IMPLANT
ELECT REM PT RETURN 9FT ADLT (ELECTROSURGICAL) ×3
ELECTRODE REM PT RTRN 9FT ADLT (ELECTROSURGICAL) ×1 IMPLANT
GLOVE BIOGEL PI IND STRL 6.5 (GLOVE) ×1 IMPLANT
GLOVE BIOGEL PI IND STRL 7.0 (GLOVE) ×1 IMPLANT
GLOVE BIOGEL PI INDICATOR 6.5 (GLOVE) ×2
GLOVE BIOGEL PI INDICATOR 7.0 (GLOVE) ×2
GLOVE ECLIPSE 6.5 STRL STRAW (GLOVE) ×3 IMPLANT
GOWN STRL REUS W/TWL LRG LVL3 (GOWN DISPOSABLE) ×6 IMPLANT
NS IRRIG 1000ML POUR BTL (IV SOLUTION) ×3 IMPLANT
PACK C SECTION WH (CUSTOM PROCEDURE TRAY) ×3 IMPLANT
PAD OB MATERNITY 4.3X12.25 (PERSONAL CARE ITEMS) ×3 IMPLANT
PENCIL SMOKE EVAC W/HOLSTER (ELECTROSURGICAL) ×3 IMPLANT
RTRCTR C-SECT PINK 25CM LRG (MISCELLANEOUS) ×3 IMPLANT
SPONGE LAP 18X18 RF (DISPOSABLE) ×9 IMPLANT
STRIP CLOSURE SKIN 1/2X4 (GAUZE/BANDAGES/DRESSINGS) ×2 IMPLANT
SUT MON AB 2-0 CT1 27 (SUTURE) ×3 IMPLANT
SUT PDS AB 0 CTX 60 (SUTURE) ×3 IMPLANT
SUT VIC AB 0 CTX 36 (SUTURE) ×8
SUT VIC AB 0 CTX36XBRD ANBCTRL (SUTURE) ×4 IMPLANT
SUT VIC AB 4-0 KS 27 (SUTURE) ×3 IMPLANT
TOWEL OR 17X24 6PK STRL BLUE (TOWEL DISPOSABLE) ×3 IMPLANT
TRAY FOLEY W/BAG SLVR 14FR LF (SET/KITS/TRAYS/PACK) ×3 IMPLANT

## 2018-03-09 NOTE — Anesthesia Procedure Notes (Signed)
Spinal  Patient location during procedure: OR Staffing Anesthesiologist: Vernis Eid E, MD Performed: anesthesiologist  Preanesthetic Checklist Completed: patient identified, surgical consent, pre-op evaluation, timeout performed, IV checked, risks and benefits discussed and monitors and equipment checked Spinal Block Patient position: sitting Prep: site prepped and draped and DuraPrep Patient monitoring: continuous pulse ox, blood pressure and heart rate Approach: midline Location: L3-4 Injection technique: single-shot Needle Needle type: Pencan  Needle gauge: 24 G Needle length: 9 cm Additional Notes Functioning IV was confirmed and monitors were applied. Sterile prep and drape, including hand hygiene and sterile gloves were used. The patient was positioned and the spine was prepped. The skin was anesthetized with lidocaine.  Free flow of clear CSF was obtained prior to injecting local anesthetic into the CSF. The needle was carefully withdrawn. The patient tolerated the procedure well.      

## 2018-03-09 NOTE — Addendum Note (Signed)
Addendum  created 03/09/18 1728 by Jhonnie GarnerMarshall, Palma Buster M, CRNA   Clinical Note Signed

## 2018-03-09 NOTE — Anesthesia Postprocedure Evaluation (Signed)
Anesthesia Post Note  Patient: Carla Walker  Procedure(s) Performed: CESAREAN SECTION WITH BILATERAL TUBAL LIGATION (N/A Abdomen)     Patient location during evaluation: PACU Anesthesia Type: Spinal Level of consciousness: oriented and awake and alert Pain management: pain level controlled Vital Signs Assessment: post-procedure vital signs reviewed and stable Respiratory status: spontaneous breathing, respiratory function stable and nonlabored ventilation Cardiovascular status: blood pressure returned to baseline and stable Postop Assessment: no headache, no backache, no apparent nausea or vomiting and spinal receding Anesthetic complications: no    Last Vitals:  Vitals:   03/09/18 1400 03/09/18 1415  BP: 107/63   Pulse: 75 74  Resp: 18 12  Temp:    SpO2: 96% 95%    Last Pain:  Vitals:   03/09/18 1345  TempSrc: Oral   Pain Goal:    LLE Motor Response: Purposeful movement (03/09/18 1415) LLE Sensation: Numbness (03/09/18 1415) RLE Motor Response: Purposeful movement (03/09/18 1415) RLE Sensation: Numbness (03/09/18 1415)      Lucretia Kernarolyn E Cidney Kirkwood

## 2018-03-09 NOTE — Plan of Care (Signed)
  Problem: Activity: Goal: Risk for activity intolerance will decrease Note:  Patient has been nauseated and vomiting, more so with activity in bed. Patient has begun to feel better after phenergan; however, is attempting to eat ice chips. To allow patient to attempt ice chips and follow with broth if tolerating ice prior to standing to prevent dizziness and/or more nausea and vomiting. Earl Galasborne, Linda HedgesStefanie Jakes CornerHudspeth

## 2018-03-09 NOTE — Anesthesia Preprocedure Evaluation (Signed)
Anesthesia Evaluation  Patient identified by MRN, date of birth, ID band Patient awake    Reviewed: Allergy & Precautions, H&P , NPO status , Patient's Chart, lab work & pertinent test results  History of Anesthesia Complications Negative for: history of anesthetic complications  Airway Mallampati: II  TM Distance: >3 FB Neck ROM: full    Dental no notable dental hx.    Pulmonary neg pulmonary ROS,    Pulmonary exam normal        Cardiovascular negative cardio ROS Normal cardiovascular exam Rhythm:regular Rate:Normal     Neuro/Psych negative neurological ROS  negative psych ROS   GI/Hepatic negative GI ROS, Neg liver ROS,   Endo/Other  Morbid obesity  Renal/GU negative Renal ROS  negative genitourinary   Musculoskeletal   Abdominal   Peds  Hematology negative hematology ROS (+)   Anesthesia Other Findings 27 yo G5P2, prior C/S x2, for scheduled C/S & BTL BMI 40, plts 296 (12/23), Ab(-)  Reproductive/Obstetrics (+) Pregnancy                            Anesthesia Physical Anesthesia Plan  ASA: III  Anesthesia Plan: Spinal   Post-op Pain Management:    Induction:   PONV Risk Score and Plan: Ondansetron and Treatment may vary due to age or medical condition  Airway Management Planned:   Additional Equipment:   Intra-op Plan:   Post-operative Plan:   Informed Consent: I have reviewed the patients History and Physical, chart, labs and discussed the procedure including the risks, benefits and alternatives for the proposed anesthesia with the patient or authorized representative who has indicated his/her understanding and acceptance.     Plan Discussed with:   Anesthesia Plan Comments:        Anesthesia Quick Evaluation

## 2018-03-09 NOTE — Plan of Care (Signed)
  Problem: Education: Goal: Knowledge of condition will improve Note:  Admission education, safety and unit protocols reviewed with patient and family.    Problem: Nutrition: Goal: Adequate nutrition will be maintained Note:  Patient nauseated and vomiting. Carolyn Whitman, anesthesiologisSherlean Foott, notified and requested additional medication for vomiting and for pain. Phenergan and tylenol IV ordered. Earl Galasborne, Linda HedgesStefanie [place] Auburn[place]udspeth

## 2018-03-09 NOTE — Transfer of Care (Signed)
Immediate Anesthesia Transfer of Care Note  Patient: Carla Walker  Procedure(s) Performed: CESAREAN SECTION WITH BILATERAL TUBAL LIGATION (N/A Abdomen)  Patient Location: PACU  Anesthesia Type:Spinal  Level of Consciousness: awake, alert  and oriented  Airway & Oxygen Therapy: Patient Spontanous Breathing  Post-op Assessment: Report given to RN and Post -op Vital signs reviewed and stable  Post vital signs: Reviewed and stable  Last Vitals:  Vitals Value Taken Time  BP    Temp    Pulse 79 03/09/2018  1:12 PM  Resp    SpO2 96 % 03/09/2018  1:12 PM  Vitals shown include unvalidated device data.  Last Pain:  Vitals:   03/09/18 1006  TempSrc: Oral         Complications: No apparent anesthesia complications

## 2018-03-09 NOTE — Lactation Note (Signed)
This note was copied from a baby's chart. Lactation Consultation Note  Patient Name: Carla Walker's Date: 03/09/2018 Reason for consult: Mother's request RN called reporting mom ready to be seen.  Mom sleeping when arrived.  Mom reports she is still not feeling well.  Mom reports she would like to be seen later,  Explained to mom infant had to eat. Mom reports she does not feel like she can hold her at this time.  Asked mom if we could do some hand expression at this time.  Able to remove 12 ml by hand expression and feed infant via spoon.  Infant still cuing but mom has fallen asleep.  Let RN know.    Maternal Data    Feeding Feeding Type: Bottle Fed - Formula(low glucose) Nipple Type: Slow - flow  LATCH Score                   Interventions    Lactation Tools Discussed/Used     Consult Status Consult Status: Follow-up Date: 03/09/18 Follow-up type: In-patient    [facility] Michaelle CopasS [name] 03/09/2018, 7:35 PM

## 2018-03-09 NOTE — Brief Op Note (Signed)
03/09/2018  1:15 PM  PATIENT:  Carla Walker  27 y.o. female  PRE-OPERATIVE DIAGNOSIS:  REPEAT, undesired fertility   POST-OPERATIVE DIAGNOSIS:  REPEAT, undesired fertility  PROCEDURE:  Procedure(s): CESAREAN SECTION WITH BILATERAL TUBAL LIGATION (N/A)  SURGEON:  Surgeon(s) and Role:    Claiborne Billings* Sparrow Sanzo, Luther ParodySidney, DO - Primary    * Marlow Baarslark, Dyanna, MD - Assisting  ANESTHESIA:   spinal  EBL:  253 mL   FINDINGS: meconium stained fluid, cephalic, female, APGARS 9/9, wt pending, normal tubes and ovaries bilaterally.  No apparent abd/pelvic scar tissue.  SPECIMEN:  Source of Specimen:  cord blood  DISPOSITION OF SPECIMEN:  N/A  COUNTS:  YES  PLAN OF CARE: Admit to inpatient   PATIENT DISPOSITION:  PACU - hemodynamically stable.   Delay start of Pharmacological VTE agent (>24hrs) due to surgical blood loss or risk of bleeding: not applicable

## 2018-03-09 NOTE — Lactation Note (Signed)
This note was copied from a baby's chart. Lactation Consultation Note  Patient Name: Carla Walker ZOXWR'UToday's Date: 03/09/2018   G3P3 LC enetered room and mom vomiting.  Mom asked if I would come back in about an hour because even though this is her third baby this is the first one she has tried to breastfed.  Went back in an hour and mom still feeling poorly.  Asked if I could come back later.  Left name and number white board.Left handouts.Did not review.  Maternal Data    Feeding Feeding Type: Bottle Fed - Formula(formula supplement for low glucose requested by Denny PeonErin NP) Nipple Type: Slow - flow  LATCH Score Latch: Grasps breast easily, tongue down, lips flanged, rhythmical sucking.  Audible Swallowing: A few with stimulation  Type of Nipple: Everted at rest and after stimulation  Comfort (Breast/Nipple): Soft / non-tender  Hold (Positioning): Assistance needed to correctly position infant at breast and maintain latch.  LATCH Score: 8  Interventions    Lactation Tools Discussed/Used     Consult Status      Carla Walker 03/09/2018, 4:24 PM

## 2018-03-09 NOTE — Anesthesia Postprocedure Evaluation (Signed)
Anesthesia Post Note  Patient: Janeece Ageentoinette Birman  Procedure(s) Performed: CESAREAN SECTION WITH BILATERAL TUBAL LIGATION (N/A Abdomen)     Patient location during evaluation: Mother Baby Anesthesia Type: Spinal Level of consciousness: awake and alert Pain management: pain level controlled Vital Signs Assessment: post-procedure vital signs reviewed and stable Respiratory status: spontaneous breathing Cardiovascular status: blood pressure returned to baseline Postop Assessment: no headache, no backache, no apparent nausea or vomiting, patient able to bend at knees, spinal receding and adequate PO intake Anesthetic complications: no    Last Vitals:  Vitals:   03/09/18 1545 03/09/18 1630  BP: 131/80 119/81  Pulse: 84 72  Resp: 17 15  Temp: (!) 36.3 C 36.5 C  SpO2: 98% 97%    Last Pain:  Vitals:   03/09/18 1630  TempSrc: Oral  PainSc: 5    Pain Goal: Patients Stated Pain Goal: 3 (03/09/18 1630)               Neill Jurewicz

## 2018-03-09 NOTE — H&P (Signed)
27 y.o. 1646w3d  [id number] comes in for schedule c/s with BTL.  Otherwise has good fetal movement and no bleeding.  Past Medical History:  Diagnosis Date  . Anemia     Past Surgical History:  Procedure Laterality Date  . CESAREAN SECTION    . HERNIA REPAIR    . TONSILLECTOMY      OB History  Gravida Para Term Preterm AB Living  5 2 2   2 2   SAB TAB Ectopic Multiple Live Births    2     2    # Outcome Date GA Lbr Len/2nd Weight Sex Delivery Anes PTL Lv  5 Current           4 Term      CS-LTranv   LIV  3 Term      CS-LTranv   LIV  2 TAB           1 TAB             Social History   Socioeconomic History  . Marital status: Single    Spouse name: Not on file  . Number of children: Not on file  . Years of education: Not on file  . Highest education level: Not on file  Occupational History  . Not on file  Social Needs  . Financial resource strain: Not on file  . Food insecurity:    Worry: Not on file    Inability: Not on file  . Transportation needs:    Medical: Not on file    Non-medical: Not on file  Tobacco Use  . Smoking status: Never Smoker  . Smokeless tobacco: Never Used  Substance and Sexual Activity  . Alcohol use: Yes    Comment: occasionally   . Drug use: No  . Sexual activity: Not on file  Lifestyle  . Physical activity:    Days per week: Not on file    Minutes per session: Not on file  . Stress: Not on file  Relationships  . Social connections:    Talks on phone: Not on file    Gets together: Not on file    Attends religious service: Not on file    Active member of club or organization: Not on file    Attends meetings of clubs or organizations: Not on file    Relationship status: Not on file  . Intimate partner violence:    Fear of current or ex partner: Not on file    Emotionally abused: Not on file    Physically abused: Not on file    Forced sexual activity: Not on file  Other Topics Concern  . Not on file  Social History Narrative  . Not on  file   Patient has no known allergies.    Prenatal Transfer Tool  Maternal Diabetes: No Genetic Screening: Normal Maternal Ultrasounds/Referrals: Normal Fetal Ultrasounds or other Referrals:  None Maternal Substance Abuse:  No Significant Maternal Medications:  None Significant Maternal Lab Results: Lab values include: Group B Strep negative (but records indicated had GBS+ urine prior)  Other PNC: uncomplicated.    Vitals:   03/09/18 0957 03/09/18 1006  BP:  131/84  Pulse:  97  Resp:  18  Temp:  97.8 F (36.6 C)  TempSrc:  Oral  Weight: 118.3 kg   Height: 5\' 7"  (1.702 m)     Lungs/Cor:  NAD Abdomen:  soft, gravid Ex:  no cords, erythema   A/P   Admit for repeat c/s  and BTL  GBS Neg per recent swab  2g Anceg  Other routine pre-op care  Philip AspenSidney Jolie Strohecker

## 2018-03-10 LAB — CBC
HCT: 31.9 % — ABNORMAL LOW (ref 36.0–46.0)
Hemoglobin: 10.4 g/dL — ABNORMAL LOW (ref 12.0–15.0)
MCH: 25.1 pg — ABNORMAL LOW (ref 26.0–34.0)
MCHC: 32.6 g/dL (ref 30.0–36.0)
MCV: 77.1 fL — ABNORMAL LOW (ref 80.0–100.0)
NRBC: 0 % (ref 0.0–0.2)
Platelets: 266 10*3/uL (ref 150–400)
RBC: 4.14 MIL/uL (ref 3.87–5.11)
RDW: 15.8 % — AB (ref 11.5–15.5)
WBC: 10 10*3/uL (ref 4.0–10.5)

## 2018-03-10 MED ORDER — ACETAMINOPHEN 325 MG PO TABS
650.0000 mg | ORAL_TABLET | Freq: Four times a day (QID) | ORAL | Status: DC | PRN
  Administered 2018-03-10 – 2018-03-13 (×5): 650 mg via ORAL
  Filled 2018-03-10 (×5): qty 2

## 2018-03-10 MED ORDER — OXYCODONE HCL 5 MG PO TABS
5.0000 mg | ORAL_TABLET | ORAL | Status: DC | PRN
  Administered 2018-03-10 – 2018-03-11 (×2): 10 mg via ORAL
  Administered 2018-03-11: 5 mg via ORAL
  Administered 2018-03-11 – 2018-03-12 (×5): 10 mg via ORAL
  Administered 2018-03-13: 5 mg via ORAL
  Filled 2018-03-10: qty 1
  Filled 2018-03-10 (×2): qty 2
  Filled 2018-03-10: qty 1
  Filled 2018-03-10 (×5): qty 2

## 2018-03-10 MED ORDER — IBUPROFEN 600 MG PO TABS
600.0000 mg | ORAL_TABLET | Freq: Four times a day (QID) | ORAL | Status: DC
  Administered 2018-03-10 – 2018-03-13 (×14): 600 mg via ORAL
  Filled 2018-03-10 (×14): qty 1

## 2018-03-10 NOTE — Op Note (Signed)
NAMJaneece Walker: Althaus, [name] MEDICAL RECORD [id number] ACCOUNT [id number] DATE OF BIRTH:17-Jan-1991 FACILITY: [facility] LOCATION: [id number] PHYSICIAN:Marty Uy M. Claiborne BillingsALLAHAN, DO  OPERATIVE REPORT  DATE OF PROCEDURE:  03/09/2018  PREOPERATIVE DIAGNOSIS:  Repeat cesarean section with desired permanent sterilization.  POSTOPERATIVE DIAGNOSIS:  Repeat cesarean section with desired permanent sterilization.  PROCEDURE:  Low transverse cesarean section with bilateral tubal ligation.  SURGEON:  Philip AspenSidney Caragh Gasper, DO  ASSISTANT:  Felipa Eveneriana Clark, MD  ANESTHESIA:  Spinal.  ESTIMATED BLOOD LOSS:  Per OR staff 253 mL, which I suspect is much under actual blood loss.  FINDINGS:  Meconium-stained fluid, cephalic, female, Apgars 9 and 9, weight pending, normal tubes and ovaries bilaterally.  No apparent abdominal or pelvic scar tissue.  SPECIMENS:  None.  COMPLICATIONS:  None.  DESCRIPTION OF PROCEDURE:  The patient was taken to the operating room where spinal anesthesia was administered and found to be adequate.  She was prepped and draped in the normal sterile fashion in dorsal supine position with a leftward tilt.  A scalpel  was used to make a Pfannenstiel skin incision which was carried down to underlying layer of fascia with Bovie cautery.  The fascia was incised in the midline with the scalpel and extended laterally with Mayo scissors.  Kocher clamps were placed at the  superior aspect of the fascial incision, and rectus muscles were dissected off bluntly and sharply.  Kocher clamps were then placed at the inferior aspect of the fascial incision.  The rectus muscles were dissected off bluntly and sharply.  The rectus  muscles were separated bluntly superiorly, and with good visualization of the peritoneum, it was entered bluntly.  Bovie cautery was used to serially extend this incision caudally with good visualization of the bladder.  Further extension of the  peritoneal incision was performed by manual lateral  traction.  The abdomen and pelvis was manually surveyed, and the Gray[employer] Electriclexis self-retractor was placed.  The vesicouterine peritoneum was tented and entered sharply with Metzenbaum scissors, extended  laterally, and the bladder flap was developed digitally.  The scalpel was used to make a low transverse cesarean incision, which was entered sharply.  The amniotic sac was entered sharply, and meconium-stained fluid was noted.  The uterine incision was  extended by cephalic and caudal traction, and the infant's head located and delivered without difficulty, followed by the remainder of the infant's body.  Thirty seconds of delayed cord clamping was performed because baby was crying and vigorous.  Baby  was bulb suctioned.  After 30 seconds, the cord was doubly clamped and cut, and the infant was handed off to awaiting neonatology.  Cord blood was collected.  External massage of the uterus was performed with gentle traction on the umbilical cord.  The  placenta was removed without difficulty and intact.  The uterus was cleared of all clot and debris, and the uterine incision was closed with 0 Vicryl in a running locked fashion, followed by second layer of vertical imbrication to control several small  bleeders along the serosal surface.  Excellent hemostasis was noted.  The patient's right tube was identified, elevated with Babcock clamps, and followed to its fimbriated end.  A Filshie clip was placed at the midpoint with verification that it  completely encompassed the tube.  The same procedure was performed on the left side.  Both ovaries were identified and found to be normal.  The Alexis self-retractor was then removed.  The peritoneum identified, reapproximated, and closed with Monocryl  in a running fashion with 3  continuous sutures going inferiorly to superiorly to reapproximate muscle.  All surfaces were observed, and additional hemostasis was achieved with Bovie cautery on the anterior left muscle wall.  No  further bleeding was  noted.  The fascia was reapproximated and closed with looped PDS in a running fashion.  Subcutaneous tissue was irrigated, dried and minimal use of Bovie cautery was necessary to control small bleeders at the level of the subcuticular tissue.  The skin  was reapproximated and closed with Vicryl on a Keith needle.  The patient tolerated the procedure well.  Sponge, lap and needle counts were correct x2.  The patient was taken to recovery in stable condition.  LN/NUANCE  D:03/10/2018 T:03/10/2018 JOB:[id number]

## 2018-03-10 NOTE — Plan of Care (Signed)
  Problem: Activity: Goal: Risk for activity intolerance will decrease Outcome: Completed/Met Note:  Patient ambulating well. Discussed the importance of ambulating in hallway at least 4 times a day.   Problem: Elimination: Goal: Will not experience complications related to urinary retention Note:  Encouraged increased po fluid intake and encouraged patient to attempt to void frequently. Patient has not voided yet; discussed in and cath with patient if she cannot void. Maxwell Caul, Leretha Dykes [place]

## 2018-03-10 NOTE — Lactation Note (Signed)
This note was copied from a baby's chart. Lactation Consultation Note  Patient Name: Carla Janeece Ageentoinette Walker ZOXWR'UToday's Date: 03/10/2018 Reason for consult: Follow-up assessment;Hyperbilirubinemia;Term;1st time breastfeeding  P3 mother whose infant is now 6024 hours old.  This is mother's third child but first time breast feeding.  Baby was not under phototherapy lights when I arrived.  A bank light and a bili blanket were set up in the room.    Mother was changing his diaper and he remained irritable after diaper change.  I offered to assist with latching and mother accepted.  Positioned mother appropriately in the football hold and assisted baby to latch on the right breast without difficulty.  Mother's breasts are soft and non tender and nipples are intact and everted.  Baby began rhythmic sucking and continued to pace nicely at the breast.  Audible swallows noted and pointed out to mother so she could be aware of swallowing.  He continued to feed while I performed breast compressions.  Mother did a return demonstration of breast compressions.  I also assisted her with hand positioning for an appropriate hold.  After self releasing, baby was burped.  Since he was still showing feeding cues I assisted to latch to the left breast in the football hold without difficulty.  Allowed mother to take over holding and doing breast compressions.  Asked father to assist mother with compressions and to help keep baby awake at the breast.  Bili blanket placed over his back during feedings.  Reitterated the importance of feeding and keeping baby under phototherapy immediately after feedings have finished.  Mother had 9 mls of EBM at bedside from earlier today.  Reminded her to immediately feed back any EBM she obtains to baby.  Mother verbalized understanding.  Pediatrician and RN in to visit with parents during breast feeding.  Pediatrician spoke with mother about a bilirubin level that was going to be drawn soon and  the possibility of baby being transferred to the NICU for phototherapy management depending upon bilirubin results.  Mother and father understand the possibility, however, mother sincerely hoping that she can keep baby out of the NICU.  She voiced concern that she really wants to make breast feeding work.    RN updated and she will go back in the room shortly to follow up with mother and the feeding.  Mother will call for further questions/concerns.   Maternal Data Formula Feeding for Exclusion: No Has patient been taught Hand Expression?: Yes Does the patient have breastfeeding experience prior to this delivery?: No  Feeding Feeding Type: Breast Fed  LATCH Score Latch: Grasps breast easily, tongue down, lips flanged, rhythmical sucking.  Audible Swallowing: Spontaneous and intermittent  Type of Nipple: Everted at rest and after stimulation  Comfort (Breast/Nipple): Soft / non-tender  Hold (Positioning): Assistance needed to correctly position infant at breast and maintain latch.  LATCH Score: 9  Interventions Interventions: Breast feeding basics reviewed;Assisted with latch;Skin to skin;Breast massage;Hand express;Breast compression;Expressed milk;Position options;Support pillows;Adjust position  Lactation Tools Discussed/Used     Consult Status Consult Status: Follow-up Date: 03/11/18 Follow-up type: In-patient    Dora SimsBeth R Jeshurun Oaxaca 03/10/2018, 12:55 PM

## 2018-03-10 NOTE — Lactation Note (Signed)
This note was copied from a baby's chart. Lactation Consultation Note  Patient Name: Carla Walker Ageentoinette Kosik WUJWJ'XToday's Date: 03/10/2018 Reason for consult: Initial assessment;1st time breastfeeding;Term P3, 17 hour female infant, LGA and mom had c/s delivery with weight loss -1%, infant on billi lights due to jaundice. Per mom, infant had 4 voids and one stool.  LC in room nurse changed infant's diaper infant had 2 stool and one void,  Per mom, she doesn't have breast pump at home. [facility] [place] Mom did not attend any BF classes in pregnancy and this is her first time breastfeeding.  LC did not observe latch at this time, per mom,  she BF infant for 10 minutes and infant received 10 ml of Gerber formula 20 kcal with iron. Mom will call Nurse or LC to assist with latch at next feeding. Per mom, infant only latching to one breast. LC discussed I & O. Mom shown how to use DEBP & how to disassemble, clean, & reassemble parts. Mom made aware of O/P services, breastfeeding support groups, community resources, and our phone # for post-discharge questions.  Mom will use DEBP every 3 hours for 15 minutes. BF plans: 1. Mom will BF according hunger cues and not exceed 3 hours without BF infant. 2. Mom will offer EBM or formula after putting infant to breast. 3. Mom will use DEBP and pump every 3 hours. 4. Mom will ask for assistance from Nurse or LC to help with latching infant to the breast.  Maternal Data Formula Feeding for Exclusion: No Has patient been taught Hand Expression?: Yes Does the patient have breastfeeding experience prior to this delivery?: No  Feeding Feeding Type: Breast Fed Nipple Type: Slow - flow  LATCH Score                   Interventions Interventions: Breast feeding basics reviewed;Hand express;DEBP;Hand pump  Lactation Tools Discussed/Used WIC Program: No Pump Review: Setup, frequency, and cleaning;Milk Storage Initiated  by:: Danelle Earthlyobin Sorayah Schrodt, IBCLC Date initiated:: 03/10/18   Consult Status Consult Status: Follow-up Date: 03/10/18 Follow-up type: In-patient    Danelle EarthlyRobin Keng Jewel 03/10/2018, 6:04 AM

## 2018-03-10 NOTE — Progress Notes (Signed)
Patient is doing well.  She is tolerating PO, ambulating.  Foley catheter just removed--awaiting void.  Pain is controlled.  Lochia is appropriate.  Nausea yesterday much improved.   Vitals:   03/09/18 1730 03/09/18 2130 03/10/18 0130 03/10/18 0530  BP: 122/77  112/69 116/79  Pulse: 72  73 68  Resp: 15 18 20 18   Temp: 97.8 F (36.6 C) 99 F (37.2 C) 98.1 F (36.7 C) 98.1 F (36.7 C)  TempSrc: Oral Oral Oral Oral  SpO2: 98% 97% 99% 98%  Weight:      Height:        NAD Abdomen:  Soft--BF--unable to examine incision ext:    Symmetric, trace edema bilaterally  Lab Results  Component Value Date   WBC 10.0 03/10/2018   HGB 10.4 (L) 03/10/2018   HCT 31.9 (L) 03/10/2018   MCV 77.1 (L) 03/10/2018   PLT 266 03/10/2018    --/--/O POS, O POS Performed at [facility], [address] [place], [place] [address]  (12/23 0930)/[place]  A/P    27 y.o. [id number] POD 1 s/p RCS and BTL Routine post op and postpartum care.   No circ

## 2018-03-10 NOTE — Plan of Care (Signed)
  Problem: Elimination: Goal: Will not experience complications related to urinary retention 03/10/2018 1443 by Karn Cassissborne, Jadan Hinojos H, RN Note:  Patient was due to void around 1130. Reminded patient to attempt to void; however, patient was working with lactation feeding baby and then holding baby skin to skin for lab to draw blood multiple times and for hearing screen. Discussed once again the importance of emptying bladder and reminded patient that In and out catheter would be required if she did not void. Patient did not even attempt until 1400 due to other staff visits in room. Patient voided 700 mL with no problems. Reiterated the importance of voiding frequently. Earl Galasborne, Linda HedgesStefanie LesterHudspeth

## 2018-03-11 NOTE — Lactation Note (Signed)
This note was copied from a baby's chart. Lactation Consultation Note  Patient Name: Carla Janeece Ageentoinette Walker XWRUE'AToday's Date: 03/11/2018 Reason for consult: Follow-up assessment;NICU baby  P3 mother whose infant is now in the NICU due to increased bilirubin levels.  Mother was resting in bed when I arrived.  When questioned about her pumping, mother stated she has only pumped one time today.  I reiterated the importance of pumping every 3 hours.  This was reviewed with her yesterday.  She stated, "I was going to but I was tired."  I asked her to pump now and reminded her how valuable her breast milk is to her NICU baby.  We reviewed milk storage times, collecting and labeling the milk and milk transportation to the NICU.  I suggested that if she would like some quiet time after pumping she may ask her RN for a sign to be placed on her door.    Mother was getting up to pump as I was leaving.  RN updated.     Maternal Data Formula Feeding for Exclusion: No Has patient been taught Hand Expression?: Yes Does the patient have breastfeeding experience prior to this delivery?: No  Feeding Feeding Type: Breast Milk Nipple Type: Slow - flow  LATCH Score                   Interventions    Lactation Tools Discussed/Used Initiated by:: Already initiated   Consult Status Consult Status: Follow-up Date: 03/12/18 Follow-up type: In-patient    Carla Walker 03/11/2018, 2:27 PM

## 2018-03-11 NOTE — Lactation Note (Signed)
This note was copied from a baby's chart. Lactation Consultation Note  Patient Name: Carla Janeece Ageentoinette Walker GMWNU'UToday's Date: 03/11/2018 Reason for consult: Mother's request P3, female infant 35 hours now in NICU Hx hypoglycemia and jaundice. Mom requesting assistance with using DEBP and hand expression. LC reviewed with mom hand expression and Mom used DEBP mom hand expressed and pumped a total of 10 ml of colostrum, label given by nurse and mom  will take colostrum to NICU. Mom understands EBM only be at room temperature and used within 4 hours for infant's in NICU. Mom current BF plan: 1. Mom will latch infant to breast when NICU assess infant is ready based on medical condition. 2. Mom will hand express and use DEBP every 3 hours on initial setting for 15 minutes.  3. Mom will ask for Nurse or LC if she has any further questions or concerns regarding breastfeeding.    Maternal Data    Feeding Feeding Type: Formula Nipple Type: Slow - flow  LATCH Score                   Interventions Interventions: Hand express;DEBP  Lactation Tools Discussed/Used     Consult Status Consult Status: Follow-up Date: 03/11/18 Follow-up type: In-patient    Carla Walker 03/11/2018, 12:05 AM

## 2018-03-11 NOTE — Progress Notes (Addendum)
  Patient is eating, ambulating, voiding.  Pain control is good.  Vitals:   03/10/18 0940 03/10/18 1400 03/10/18 2101 03/11/18 0640  BP: 118/82 (!) 144/98 127/83 128/77  Pulse: 81 82 73 95  Resp: 16 20 20 16   Temp: 98 F (36.7 C) 98.4 F (36.9 C) 98.7 F (37.1 C) 99 F (37.2 C)  TempSrc: Oral Oral Oral Oral  SpO2: 99%   100%  Weight:      Height:        lungs:   clear to auscultation cor:    RRR Abdomen:  soft, appropriate tenderness, incisions intact and without erythema or exudate ex:    no cords   Lab Results  Component Value Date   WBC 10.0 03/10/2018   HGB 10.4 (L) 03/10/2018   HCT 31.9 (L) 03/10/2018   MCV 77.1 (L) 03/10/2018   PLT 266 03/10/2018    --/--/O POS, O POS Performed at [facility], [address] [place], [place] [address]  (12/23 0930)/[place]  A/P    Post operative day 2. Routine post op and postpartum care.  Expect d/c tomorrow.  Percocet for pain control.

## 2018-03-12 NOTE — Progress Notes (Signed)
Patient is eating, ambulating, voiding.  Pain control is good.  Vitals:   03/10/18 2101 03/11/18 0640 03/11/18 2133 03/12/18 0641  BP: 127/83 128/77 135/81 (!) 142/87  Pulse: 73 95 75 74  Resp: 20 16 16 18   Temp: 98.7 F (37.1 C) 99 F (37.2 C) 98.8 F (37.1 C) 98.3 F (36.8 C)  TempSrc: Oral Oral Oral Oral  SpO2:  100% 100% 99%  Weight:      Height:        Fundus firm Perineum without swelling.  Lab Results  Component Value Date   WBC 10.0 03/10/2018   HGB 10.4 (L) 03/10/2018   HCT 31.9 (L) 03/10/2018   MCV 77.1 (L) 03/10/2018   PLT 266 03/10/2018    --/--/O POS, O POS Performed at [facility], [address] [place], [place] [address]  (12/23 0930)/[place]  A/P Post partum day 3.  Routine care.  Expect d/c tomorrow.    Loney LaurenceMichelle A Briannon Boggio

## 2018-03-13 MED ORDER — OXYCODONE HCL 5 MG PO TABS: 5.0000 mg | ORAL_TABLET | ORAL | 0 refills | Status: DC | PRN

## 2018-03-13 NOTE — Lactation Note (Signed)
This note was copied from a baby's chart. Lactation Consultation Note  Patient Name: Carla Walker ZOXWR'UToday's Date: 03/13/2018   Baby Carla now 794 days old.  Mom being d/c.  Infant still in NICU on phototheraphy.  [facility]  Issued WIC rental pump to mom.  Parents voice understanding regarding use of pump and when to return it and to [facility] and not [facility]. Gave parents yellow copy of pump contract.  Reviewed with mom to take all pump parts home.  Showed her which ones to take.  When showing mom noticed tubing had condensation.  Showed mom how to turn pump on and blow out condensation.  Urged her to do it at least one time a day and anytime she noticed it.  Mom asked for more bottles to bring milk back to NICU.  Gave mom more bottles to bring milk back to in the NICU. Urged mom to follow up with lactation as needed.  Maternal Data    Feeding    LATCH Score                   Interventions    Lactation Tools Discussed/Used    l Consult Status      [name] 03/13/2018, 1:06 PM

## 2018-03-13 NOTE — Lactation Note (Signed)
This note was copied from a baby's chart. Lactation Consultation Note  Patient Name: Carla Walker WUJWJ'XToday's Date: 03/13/2018   [name] milk is coming to volume. She has recently been able to pump 50 mL. Mom states that the size 27 flanges feel too snug when pumping. Size 30 flanges provided. Mom has an area in outer quadrant of her R breast that feels a bit like "rocks and marbles." I suggested that Mom add moist heat and/or massage to that area when pumping again. She verbalized understanding.  [facility].  Mom has my # to call to inform me of her decision. WIC referral form [facility].   Lurline HareRichey, Ileigh Mettler [facility] 03/13/2018, 7:54 AM

## 2018-03-13 NOTE — Discharge Summary (Signed)
Obstetric Discharge Summary Reason for Admission: cesarean section Prenatal Procedures: ultrasound Intrapartum Procedures: cesarean: low cervical, transverse Postpartum Procedures: none Complications-Operative and Postpartum: none Hemoglobin  Date Value Ref Range Status  03/10/2018 10.4 (L) 12.0 - 15.0 g/dL Final   HCT  Date Value Ref Range Status  03/10/2018 31.9 (L) 36.0 - 46.0 % Final    Physical Exam:  General: alert and cooperative Lochia: appropriate Uterine Fundus: firm Incision: healing well, no significant drainage DVT Evaluation: No evidence of DVT seen on physical exam.  Discharge Diagnoses: Term Pregnancy-delivered  Discharge Information: Date: 03/13/2018 Activity: pelvic rest Diet: routine Medications: PNV and Ibuprofen, percocet Condition: stable Instructions: refer to practice specific booklet Discharge to: home Follow-up Information    Carla Walker, Carla Niesen, Carla Walker Follow up in 4 week(s).   Specialty:  Obstetrics and Gynecology Contact information: [address] [place] [address] 978 [phone]           Newborn Data: Live born female  Birth Weight: 9 lb 15.1 oz (4510 g) APGAR: 9, 9  Newborn Delivery   Birth date/time:  03/09/2018 12:24:00 Delivery type:  C-Section, Low Transverse Trial of labor:  No C-section categorization:  Repeat     Baby in NICU at time of mom's discharge.  Carla Walker 03/13/2018, 11:39 AM

## 2019-02-23 ENCOUNTER — Other Ambulatory Visit: Payer: Self-pay

## 2019-02-23 ENCOUNTER — Encounter: Payer: Self-pay | Admitting: Emergency Medicine

## 2019-02-23 ENCOUNTER — Ambulatory Visit
Admission: EM | Admit: 2019-02-23 | Discharge: 2019-02-23 | Disposition: A | Discharge status: Home / Self Care | Payer: 59 | Attending: Physician Assistant | Admitting: Physician Assistant

## 2019-02-23 DIAGNOSIS — J029 Acute pharyngitis, unspecified: Secondary | ICD-10-CM | POA: Diagnosis present

## 2019-02-23 LAB — POCT RAPID STREP A (OFFICE): Rapid Strep A Screen: NEGATIVE

## 2019-02-23 MED ORDER — ALBUTEROL SULFATE HFA 108 (90 BASE) MCG/ACT IN AERS: 1.0000 | INHALATION_SPRAY | Freq: Four times a day (QID) | RESPIRATORY_TRACT | 0 refills | Status: DC | PRN

## 2019-02-23 NOTE — ED Provider Notes (Signed)
[facility]    CSN: [id number] Arrival date & time: 02/23/19  1709      History   Chief Complaint Chief Complaint  Patient presents with  . Sore Throat    HPI Ara Grandmaison is a 28 y.o. female.   28 year old female comes in for few day history of sore throat. Feels "restricted" to the area without much pain. Denies rhinorrhea, nasal congestion, cough. Denies fever, chills, body aches. Denies abdominal pain, nausea, vomiting, diarrhea. Denies shortness of breath, loss of taste/smell. Has however felt some chest tightness that is intermittent for the past 2-3 weeks. No obvious aggravating or alleviating factor. Has had to use albuterol in the past without history of asthma. Never smoker, but has been passive smoker.      Past Medical History:  Diagnosis Date  . Anemia     Patient Active Problem List   Diagnosis Date Noted  . Cesarean delivery delivered 03/09/2018  . Encounter for maternal care for low transverse scar from repeat cesarean delivery 03/09/2018    Past Surgical History:  Procedure Laterality Date  . CESAREAN SECTION    . CESAREAN SECTION WITH BILATERAL TUBAL LIGATION N/A 03/09/2018   Procedure: CESAREAN SECTION WITH BILATERAL TUBAL LIGATION;  Surgeon: Philip Aspen, DO;  Location: [facility];  Service: Obstetrics;  Laterality: N/A;  . HERNIA REPAIR    . TONSILLECTOMY      OB History    Gravida  5   Para  3   Term  3   Preterm      AB  2   Living  3     SAB      TAB  2   Ectopic      Multiple  0   Live Births  3            Home Medications    Prior to Admission medications   Medication Sig Start Date End Date Taking? Authorizing Provider  albuterol (VENTOLIN HFA) 108 (90 Base) MCG/ACT inhaler Inhale 1-2 puffs into the lungs every 6 (six) hours as needed for wheezing or shortness of breath. 02/23/19   Belinda Fisher, PA-C  Prenatal MV & Min w/FA-DHA ([employer] PRENATAL GUMMY PO) Take 2 each by mouth daily.     [provider]    Family History Family History  Problem Relation Age of Onset  . Diabetes Father   . Hypertension Father   . Diabetes Paternal Uncle   . Hypertension Paternal Uncle   . Diabetes Paternal Grandfather   . Hypertension Paternal Grandfather     Social History Social History   Tobacco Use  . Smoking status: Never Smoker  . Smokeless tobacco: Never Used  Substance Use Topics  . Alcohol use: Yes    Comment: occasionally   . Drug use: No     Allergies   Patient has no known allergies.   Review of Systems Review of Systems  Reason unable to perform ROS: See HPI as above.     Physical Exam Triage Vital Signs ED Triage Vitals [02/23/19 1725]  Enc Vitals Group     BP 121/81     Pulse Rate 80     Resp 18     Temp 97.9 F (36.6 C)     Temp Source Temporal     SpO2 96 %     Weight      Height      Head Circumference      Peak  Flow      Pain Score 3     Pain Loc      Pain Edu?      Excl. in [place]?    No data found.  Updated Vital Signs BP 121/81 (BP Location: Left Arm)   Pulse 80   Temp 97.9 F (36.6 C) (Temporal)   Resp 18   SpO2 96%   Physical Exam Constitutional:      General: She is not in acute distress.    Appearance: Normal appearance. She is not ill-appearing, toxic-appearing or diaphoretic.  HENT:     Head: Normocephalic and atraumatic.     Mouth/Throat:     Mouth: Mucous membranes are moist.     Pharynx: Oropharynx is clear. Uvula midline. No posterior oropharyngeal erythema or uvula swelling.  Cardiovascular:     Rate and Rhythm: Normal rate and regular rhythm.     Heart sounds: Normal heart sounds. No murmur. No friction rub. No gallop.   Pulmonary:     Effort: Pulmonary effort is normal. No accessory muscle usage, prolonged expiration, respiratory distress or retractions.     Comments: Lungs clear to auscultation without adventitious lung sounds. Musculoskeletal:     Cervical back: Normal range of motion and neck  supple.  Neurological:     General: No focal deficit present.     Mental Status: She is alert and oriented to person, place, and time.      UC Treatments / Results  Labs (all labs ordered are listed, but only abnormal results are displayed) Labs Reviewed  POCT RAPID STREP A (OFFICE) - Normal  CULTURE, GROUP A STREP ([place])  NOVEL CORONAVIRUS, NAA    EKG   Radiology No results found.  Procedures Procedures (including critical care time)  Medications Ordered in UC Medications - No data to display  Initial Impression / Assessment and Plan / UC Course  I have reviewed the triage vital signs and the nursing notes.  Pertinent labs & imaging results that were available during my care of the patient were reviewed by me and considered in my medical decision making (see chart for details).    Rapid strep negative. COVID testing ordered. Patient to quarantine until testing results return. No alarming signs on exam.  Patient speaking in full sentences without respiratory distress.  Symptomatic treatment discussed.  Push fluids.  Return precautions given.  Patient expresses understanding and agrees to plan.  Final Clinical Impressions(s) / UC Diagnoses   Final diagnoses:  Sore throat   ED Prescriptions    Medication Sig Dispense Auth. Provider   albuterol (VENTOLIN HFA) 108 (90 Base) MCG/ACT inhaler Inhale 1-2 puffs into the lungs every 6 (six) hours as needed for wheezing or shortness of breath. 8 g Ok Edwards, PA-C     PDMP not reviewed this encounter.   Ok Edwards, PA-C 02/23/19 1758

## 2019-02-23 NOTE — ED Triage Notes (Signed)
Pt presents to [facility] for assessment of sore throat with two kids at home with strep.  Patient states she also sometimes has a tight feeling in her chest with deep inspiration x 2-3 weeks.

## 2019-02-23 NOTE — Discharge Instructions (Addendum)
Rapid strep negative. COVID testing ordered.I would like you to quarantine until testing results. Albuterol as needed for chest tightness. You can take over the counter flonase/nasacort to help with nasal congestion/drainage. If experiencing shortness of breath, trouble breathing, go to the emergency department for further evaluation needed.

## 2019-02-25 LAB — NOVEL CORONAVIRUS, NAA: SARS-CoV-2, NAA: NOT DETECTED

## 2019-03-01 LAB — CULTURE, GROUP A STREP (THRC)

## 2019-08-22 ENCOUNTER — Ambulatory Visit
Admission: EM | Admit: 2019-08-22 | Discharge: 2019-08-22 | Disposition: A | Discharge status: Home / Self Care | Payer: 59 | Attending: Emergency Medicine | Admitting: Emergency Medicine

## 2019-08-22 ENCOUNTER — Other Ambulatory Visit: Payer: Self-pay

## 2019-08-22 DIAGNOSIS — H60391 Other infective otitis externa, right ear: Secondary | ICD-10-CM | POA: Diagnosis not present

## 2019-08-22 MED ORDER — NEOMYCIN-POLYMYXIN-HC 3.5-10000-1 OT SOLN: 3.0000 [drp] | Freq: Three times a day (TID) | OTIC | 0 refills | Status: AC

## 2019-08-22 NOTE — ED Provider Notes (Addendum)
[facility]    CSN: [id number] Arrival date & time: 08/22/19  0831      History   Chief Complaint Chief Complaint  Patient presents with  . Otalgia    HPI Carla Walker is a 29 y.o. female with history of anemia presenting for 1 week course of right ear pain.  States it radiates to her right jaw.  Denies dental pain, sore throat, nasal congestion or pressure, fever.  Denies drooling, decreased appetite or vomiting.  No cough, difficulty breathing.  Denies bruxism, trauma to area.  No tinnitus, dizziness, change in hearing.   Past Medical History:  Diagnosis Date  . Anemia     Patient Active Problem List   Diagnosis Date Noted  . Cesarean delivery delivered 03/09/2018  . Encounter for maternal care for low transverse scar from repeat cesarean delivery 03/09/2018    Past Surgical History:  Procedure Laterality Date  . CESAREAN SECTION    . CESAREAN SECTION WITH BILATERAL TUBAL LIGATION N/A 03/09/2018   Procedure: CESAREAN SECTION WITH BILATERAL TUBAL LIGATION;  Surgeon: Philip Aspen, DO;  Location: [facility];  Service: Obstetrics;  Laterality: N/A;  . HERNIA REPAIR    . TONSILLECTOMY      OB History    Gravida  5   Para  3   Term  3   Preterm      AB  2   Living  3     SAB      TAB  2   Ectopic      Multiple  0   Live Births  3            Home Medications    Prior to Admission medications   Medication Sig Start Date End Date Taking? Authorizing Provider  albuterol (VENTOLIN HFA) 108 (90 Base) MCG/ACT inhaler Inhale 1-2 puffs into the lungs every 6 (six) hours as needed for wheezing or shortness of breath. 02/23/19   Cathie Hoops, Amy V, PA-C  neomycin-polymyxin-hydrocortisone (CORTISPORIN) OTIC solution Place 3 drops into the right ear 3 (three) times daily for 7 days. 08/22/19 08/29/19  Hall-Potvin, [place], PA-C    Family History Family History  Problem Relation Age of Onset  . Diabetes Father   . Hypertension Father     . Diabetes Paternal Uncle   . Hypertension Paternal Uncle   . Diabetes Paternal Grandfather   . Hypertension Paternal Grandfather     Social History Social History   Tobacco Use  . Smoking status: Never Smoker  . Smokeless tobacco: Never Used  Substance Use Topics  . Alcohol use: Yes    Comment: occasionally   . Drug use: No     Allergies   Patient has no known allergies.   Review of Systems As per HPI   Physical Exam Triage Vital Signs ED Triage Vitals  Enc Vitals Group     BP      Pulse      Resp      Temp      Temp src      SpO2      Weight      Height      Head Circumference      Peak Flow      Pain Score      Pain Loc      Pain Edu?      Excl. in GC?    No data found.  Updated Vital Signs BP (!) 130/93 (BP Location: Left Arm)  Pulse 83   Temp 98.6 F (37 C) (Oral)   Resp 18   LMP 07/31/2019   SpO2 96%   Breastfeeding No   Visual Acuity Right Eye Distance:   Left Eye Distance:   Bilateral Distance:    Right Eye Near:   Left Eye Near:    Bilateral Near:     Physical Exam Constitutional:      General: She is not in acute distress. HENT:     Head: Normocephalic and atraumatic.     Jaw: There is normal jaw occlusion. No tenderness or pain on movement.     Right Ear: Hearing, tympanic membrane and external ear normal. No tenderness. No mastoid tenderness.     Left Ear: Hearing, tympanic membrane, ear canal and external ear normal. No tenderness. No mastoid tenderness.     Ears:     Comments: Right ear with mild tragal tenderness.  EAC edematous, erythematous as compared to left.  No foreign body    Nose: No nasal deformity, septal deviation or nasal tenderness.     Right Turbinates: Not swollen or pale.     Left Turbinates: Not swollen or pale.     Right Sinus: No maxillary sinus tenderness or frontal sinus tenderness.     Left Sinus: No maxillary sinus tenderness or frontal sinus tenderness.     Mouth/Throat:     Lips: Pink. No  lesions.     Mouth: Mucous membranes are moist. No injury.     Pharynx: Oropharynx is clear. Uvula midline. No posterior oropharyngeal erythema or uvula swelling.     Comments: no tonsillar exudate or hypertrophy Cardiovascular:     Rate and Rhythm: Normal rate.  Pulmonary:     Effort: Pulmonary effort is normal.  Musculoskeletal:     Cervical back: Normal range of motion and neck supple. No muscular tenderness.  Lymphadenopathy:     Cervical: No cervical adenopathy.  Neurological:     Mental Status: She is alert and oriented to person, place, and time.      UC Treatments / Results  Labs (all labs ordered are listed, but only abnormal results are displayed) Labs Reviewed - No data to display  EKG   Radiology No results found.  Procedures Procedures (including critical care time)  Medications Ordered in UC Medications - No data to display  Initial Impression / Assessment and Plan / UC Course  I have reviewed the triage vital signs and the nursing notes.  Pertinent labs & imaging results that were available during my care of the patient were reviewed by me and considered in my medical decision making (see chart for details).     Patient febrile, nontoxic in office today.  H&P consistent with AOE.  Drops sent, reviewed administration of otic drops with patient.  Return precautions discussed, patient verbalized understanding and is agreeable to plan. Final Clinical Impressions(s) / UC Diagnoses   Final diagnoses:  Infective otitis externa of right ear     Discharge Instructions     Use eardrops as prescribed for the next week. Return for worsening ear pain, swelling, discharge, bleeding, decreased hearing, development of jaw pain/swelling, fever.  Do NOT use Q-tips as these can cause your ear wax to get stuck, the tips may break off and become a foreign body requiring additional medical care, or puncture your eardrum.  Helpful prevention tip: Use a solution of  equal parts isopropyl (rubbing) alcohol and white vinegar (acetic acid) in both ears after swimming.  ED Prescriptions    Medication Sig Dispense Auth. Provider   neomycin-polymyxin-hydrocortisone (CORTISPORIN) OTIC solution Place 3 drops into the right ear 3 (three) times daily for 7 days. 10 mL Hall-Potvin, [place], PA-C     PDMP not reviewed this encounter.   Hall-Potvin, [place], PA-C 08/22/19 0854    Odette Fraction [place], [place] 08/22/19 [phone]

## 2019-08-22 NOTE — Discharge Instructions (Addendum)
Use eardrops as prescribed for the next week. Return for worsening ear pain, swelling, discharge, bleeding, decreased hearing, development of jaw pain/swelling, fever.  Do NOT use Q-tips as these can cause your ear wax to get stuck, the tips may break off and become a foreign body requiring additional medical care, or puncture your eardrum.  Helpful prevention tip: Use a solution of equal parts isopropyl (rubbing) alcohol and white vinegar (acetic acid) in both ears after swimming. 

## 2019-08-22 NOTE — ED Triage Notes (Signed)
Pt c/o rt ear pain radiating to rt jaw area for over a week.

## 2019-08-31 ENCOUNTER — Other Ambulatory Visit: Payer: Self-pay

## 2019-09-03 ENCOUNTER — Ambulatory Visit: Payer: 59 | Admitting: Family Medicine

## 2019-09-03 DIAGNOSIS — Z0289 Encounter for other administrative examinations: Secondary | ICD-10-CM

## 2019-09-26 ENCOUNTER — Emergency Department (HOSPITAL_BASED_OUTPATIENT_CLINIC_OR_DEPARTMENT_OTHER)
Admission: EM | Admit: 2019-09-26 | Discharge: 2019-09-26 | Disposition: A | Discharge status: Home / Self Care | Payer: 59 | Attending: Emergency Medicine | Admitting: Emergency Medicine

## 2019-09-26 ENCOUNTER — Other Ambulatory Visit: Payer: Self-pay

## 2019-09-26 ENCOUNTER — Encounter (HOSPITAL_BASED_OUTPATIENT_CLINIC_OR_DEPARTMENT_OTHER): Payer: Self-pay | Admitting: *Deleted

## 2019-09-26 ENCOUNTER — Emergency Department (HOSPITAL_BASED_OUTPATIENT_CLINIC_OR_DEPARTMENT_OTHER): Payer: 59

## 2019-09-26 DIAGNOSIS — R0789 Other chest pain: Secondary | ICD-10-CM | POA: Diagnosis present

## 2019-09-26 MED ORDER — KETOROLAC TROMETHAMINE 60 MG/2ML IM SOLN
60.0000 mg | Freq: Once | INTRAMUSCULAR | Status: AC
  Administered 2019-09-26: 60 mg via INTRAMUSCULAR
  Filled 2019-09-26: qty 2

## 2019-09-26 NOTE — Discharge Instructions (Addendum)
Take NSAIDs or Tylenol as needed for the next week. Take this medicine with food. Use a heating pad for sore muscles - use for 20 minutes several times a day Follow-up with your primary care doctor if your symptoms not improve Try gentle range of motion exercises Return to the ER for worsening symptoms
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
## 2019-11-01 ENCOUNTER — Other Ambulatory Visit: Payer: Self-pay

## 2019-11-01 ENCOUNTER — Encounter: Payer: Self-pay | Admitting: Emergency Medicine

## 2019-11-01 ENCOUNTER — Ambulatory Visit
Admission: EM | Admit: 2019-11-01 | Discharge: 2019-11-01 | Disposition: A | Discharge status: Home / Self Care | Payer: 59 | Attending: Emergency Medicine | Admitting: Emergency Medicine

## 2019-11-01 DIAGNOSIS — Z20822 Contact with and (suspected) exposure to covid-19: Secondary | ICD-10-CM

## 2019-11-01 NOTE — ED Provider Notes (Signed)
[facility]    CSN: [id number] Arrival date & time: 11/01/19  1130      History   Chief Complaint Chief Complaint  Patient presents with  . covid exposure    HPI Carla Walker is a 29 y.o. female  Presenting for Covid testing: Exposure: daughter's classmate Date of exposure: last week Any fever, symptoms since exposure: none   Past Medical History:  Diagnosis Date  . Anemia     Patient Active Problem List   Diagnosis Date Noted  . Cesarean delivery delivered 03/09/2018  . Encounter for maternal care for low transverse scar from repeat cesarean delivery 03/09/2018    Past Surgical History:  Procedure Laterality Date  . CESAREAN SECTION    . CESAREAN SECTION WITH BILATERAL TUBAL LIGATION N/A 03/09/2018   Procedure: CESAREAN SECTION WITH BILATERAL TUBAL LIGATION;  Surgeon: Philip Aspen, DO;  Location: [facility];  Service: Obstetrics;  Laterality: N/A;  . HERNIA REPAIR    . TONSILLECTOMY      OB History    Gravida  5   Para  3   Term  3   Preterm      AB  2   Living  3     SAB      TAB  2   Ectopic      Multiple  0   Live Births  3            Home Medications    Prior to Admission medications   Medication Sig Start Date End Date Taking? Authorizing Provider  albuterol (VENTOLIN HFA) 108 (90 Base) MCG/ACT inhaler Inhale 1-2 puffs into the lungs every 6 (six) hours as needed for wheezing or shortness of breath. 02/23/19   Belinda Fisher, PA-C    Family History Family History  Problem Relation Age of Onset  . Diabetes Father   . Hypertension Father   . Diabetes Paternal Uncle   . Hypertension Paternal Uncle   . Diabetes Paternal Grandfather   . Hypertension Paternal Grandfather     Social History Social History   Tobacco Use  . Smoking status: Never Smoker  . Smokeless tobacco: Never Used  Vaping Use  . Vaping Use: Never used  Substance Use Topics  . Alcohol use: Yes    Comment: occasionally   .  Drug use: No     Allergies   Patient has no known allergies.   Review of Systems As per HPI   Physical Exam Triage Vital Signs ED Triage Vitals  Enc Vitals Group     BP      Pulse      Resp      Temp      Temp src      SpO2      Weight      Height      Head Circumference      Peak Flow      Pain Score      Pain Loc      Pain Edu?      Excl. in GC?    No data found.  Updated Vital Signs BP 122/85 (BP Location: Left Arm) Comment (BP Location): large cuff  Pulse 69   Temp 98 F (36.7 C) (Oral)   Resp 18   SpO2 96%   Visual Acuity Right Eye Distance:   Left Eye Distance:   Bilateral Distance:    Right Eye Near:   Left Eye Near:    Bilateral  Near:     Physical Exam Constitutional:      General: She is not in acute distress. HENT:     Head: Normocephalic and atraumatic.  Eyes:     General: No scleral icterus.    Pupils: Pupils are equal, round, and reactive to light.  Cardiovascular:     Rate and Rhythm: Normal rate and regular rhythm.  Pulmonary:     Effort: Pulmonary effort is normal. No respiratory distress.     Breath sounds: No wheezing.  Skin:    Coloration: Skin is not jaundiced or pale.  Neurological:     Mental Status: She is alert and oriented to person, place, and time.      UC Treatments / Results  Labs (all labs ordered are listed, but only abnormal results are displayed) Labs Reviewed  NOVEL CORONAVIRUS, NAA    EKG   Radiology No results found.  Procedures Procedures (including critical care time)  Medications Ordered in UC Medications - No data to display  Initial Impression / Assessment and Plan / UC Course  I have reviewed the triage vital signs and the nursing notes.  Pertinent labs & imaging results that were available during my care of the patient were reviewed by me and considered in my medical decision making (see chart for details).     Patient afebrile, nontoxic, with SpO2 96%.  Covid PCR pending.   Patient to quarantine until results are back.  We will treat supportively as outlined below.  Return precautions discussed, patient verbalized understanding and is agreeable to plan. Final Clinical Impressions(s) / UC Diagnoses   Final diagnoses:  Encounter for laboratory testing for COVID-19 virus  Contact with and (suspected) exposure to covid-19     Discharge Instructions     Your COVID test is pending - it is important to quarantine / isolate at home until your results are back. If you test positive and would like further evaluation for persistent or worsening symptoms, you may schedule an E-visit or virtual (video) visit throughout the [facility] app or website.  PLEASE NOTE: If you develop severe chest pain or shortness of breath please go to the ER or call 9-1-1 for further evaluation --> DO NOT schedule electronic or virtual visits for this. Please call our office for further guidance / recommendations as needed.  For information about the Covid vaccine, please visit [email or web address]    ED Prescriptions    None     PDMP not reviewed this encounter.   Hall-Potvin, [place], [place] 11/01/19 1220

## 2019-11-01 NOTE — Discharge Instructions (Signed)
Your COVID test is pending - it is important to quarantine / isolate at home until your results are back. °If you test positive and would like further evaluation for persistent or worsening symptoms, you may schedule an E-visit or virtual (video) visit throughout the [facility] My[facility] app or website. ° °PLEASE NOTE: If you develop severe chest pain or shortness of breath please go to the ER or call 9-1-1 for further evaluation --> DO NOT schedule electronic or virtual visits for this. °Please call our office for further guidance / recommendations as needed. ° °For information about the Covid vaccine, please visit [email or web address] °

## 2019-11-01 NOTE — ED Triage Notes (Addendum)
Patient ear and throat pain.  2 children, one with symptoms  Has been exposed to covid

## 2019-11-02 LAB — NOVEL CORONAVIRUS, NAA: SARS-CoV-2, NAA: NOT DETECTED

## 2019-11-02 LAB — SARS-COV-2, NAA 2 DAY TAT

## 2019-12-03 ENCOUNTER — Encounter: Payer: Self-pay | Admitting: Emergency Medicine

## 2019-12-03 ENCOUNTER — Other Ambulatory Visit: Payer: Self-pay

## 2019-12-03 ENCOUNTER — Ambulatory Visit
Admission: EM | Admit: 2019-12-03 | Discharge: 2019-12-03 | Disposition: A | Discharge status: Home / Self Care | Payer: 59 | Attending: Family Medicine | Admitting: Family Medicine

## 2019-12-03 DIAGNOSIS — Z1152 Encounter for screening for COVID-19: Secondary | ICD-10-CM

## 2019-12-03 DIAGNOSIS — J302 Other seasonal allergic rhinitis: Secondary | ICD-10-CM | POA: Diagnosis not present

## 2019-12-03 NOTE — ED Triage Notes (Signed)
Pt sts nasal and sinus congestion with cough and body aches x 3 days pt sts sore throat this am

## 2019-12-03 NOTE — Discharge Instructions (Addendum)
Use Zyrtec

## 2019-12-03 NOTE — ED Provider Notes (Addendum)
[facility]    CSN: [id number] Arrival date & time: 12/03/19  0909      History   Chief Complaint Chief Complaint  Patient presents with  . Nasal Congestion  . Sore Throat    HPI Carla Walker is a 29 y.o. female.   Complains of cough sore throat congestion.  Cough is occasionally productive usually related to postnasal drainage.  No fever.  No known exposure to Covid  HPI  Past Medical History:  Diagnosis Date  . Anemia     Patient Active Problem List   Diagnosis Date Noted  . Cesarean delivery delivered 03/09/2018  . Encounter for maternal care for low transverse scar from repeat cesarean delivery 03/09/2018    Past Surgical History:  Procedure Laterality Date  . CESAREAN SECTION    . CESAREAN SECTION WITH BILATERAL TUBAL LIGATION N/A 03/09/2018   Procedure: CESAREAN SECTION WITH BILATERAL TUBAL LIGATION;  Surgeon: Philip Aspen, DO;  Location: [facility];  Service: Obstetrics;  Laterality: N/A;  . HERNIA REPAIR    . TONSILLECTOMY      OB History    Gravida  5   Para  3   Term  3   Preterm      AB  2   Living  3     SAB      TAB  2   Ectopic      Multiple  0   Live Births  3            Home Medications    Prior to Admission medications   Medication Sig Start Date End Date Taking? Authorizing Provider  albuterol (VENTOLIN HFA) 108 (90 Base) MCG/ACT inhaler Inhale 1-2 puffs into the lungs every 6 (six) hours as needed for wheezing or shortness of breath. 02/23/19   Belinda Fisher, PA-C    Family History Family History  Problem Relation Age of Onset  . Diabetes Father   . Hypertension Father   . Diabetes Paternal Uncle   . Hypertension Paternal Uncle   . Diabetes Paternal Grandfather   . Hypertension Paternal Grandfather     Social History Social History   Tobacco Use  . Smoking status: Never Smoker  . Smokeless tobacco: Never Used  Vaping Use  . Vaping Use: Never used  Substance Use Topics  .  Alcohol use: Yes    Comment: occasionally   . Drug use: No     Allergies   Patient has no known allergies.   Review of Systems Review of Systems  Constitutional: Negative.   HENT: Positive for congestion and sore throat.   Respiratory: Positive for cough.   All other systems reviewed and are negative.    Physical Exam Triage Vital Signs ED Triage Vitals  Enc Vitals Group     BP 12/03/19 1004 (!) 152/103     Pulse Rate 12/03/19 1004 87     Resp 12/03/19 1004 18     Temp 12/03/19 1004 97.7 F (36.5 C)     Temp Source 12/03/19 1004 Oral     SpO2 12/03/19 1004 97 %     Weight --      Height --      Head Circumference --      Peak Flow --      Pain Score 12/03/19 1005 7     Pain Loc --      Pain Edu? --      Excl. in GC? --  No data found.  Updated Vital Signs BP (!) 152/103 (BP Location: Left Arm)   Pulse 87   Temp 97.7 F (36.5 C) (Oral)   Resp 18   SpO2 97%   Visual Acuity Right Eye Distance:   Left Eye Distance:   Bilateral Distance:    Right Eye Near:   Left Eye Near:    Bilateral Near:     Physical Exam Constitutional:      Appearance: She is well-developed. She is obese.  HENT:     Head: Normocephalic.     Right Ear: Tympanic membrane normal.     Left Ear: Tympanic membrane normal.     Mouth/Throat:     Mouth: Mucous membranes are moist.  Cardiovascular:     Rate and Rhythm: Normal rate and regular rhythm.  Pulmonary:     Effort: Pulmonary effort is normal.     Breath sounds: Normal breath sounds.  Abdominal:     Palpations: Abdomen is soft.  Neurological:     Mental Status: She is alert.      UC Treatments / Results  Labs (all labs ordered are listed, but only abnormal results are displayed) Labs Reviewed  NOVEL CORONAVIRUS, NAA    EKG   Radiology No results found.  Procedures Procedures (including critical care time)  Medications Ordered in UC Medications - No data to display  Initial Impression / Assessment and  Plan / UC Course  I have reviewed the triage vital signs and the nursing notes.  Pertinent labs & imaging results that were available during my care of the patient were reviewed by me and considered in my medical decision making (see chart for details).    Allergic rhinitis Final Clinical Impressions(s) / UC Diagnoses   Final diagnoses:  Encounter for screening for COVID-19   Discharge Instructions   None    ED Prescriptions    None     PDMP not reviewed this encounter.   Frederica Kuster, MD 12/03/19 1043    Frederica Kuster, MD 12/03/19 1050

## 2019-12-05 LAB — SARS-COV-2, NAA 2 DAY TAT

## 2019-12-05 LAB — NOVEL CORONAVIRUS, NAA: SARS-CoV-2, NAA: NOT DETECTED

## 2020-04-27 ENCOUNTER — Other Ambulatory Visit: Payer: Self-pay

## 2020-04-27 ENCOUNTER — Encounter (HOSPITAL_COMMUNITY): Payer: Self-pay | Admitting: Emergency Medicine

## 2020-04-27 ENCOUNTER — Emergency Department (HOSPITAL_COMMUNITY): Payer: Self-pay

## 2020-04-27 ENCOUNTER — Emergency Department (HOSPITAL_COMMUNITY)
Admission: EM | Admit: 2020-04-27 | Discharge: 2020-04-27 | Disposition: A | Discharge status: Home / Self Care | Payer: Self-pay | Attending: Emergency Medicine | Admitting: Emergency Medicine

## 2020-04-27 DIAGNOSIS — R059 Cough, unspecified: Secondary | ICD-10-CM | POA: Insufficient documentation

## 2020-04-27 DIAGNOSIS — R0789 Other chest pain: Secondary | ICD-10-CM | POA: Insufficient documentation

## 2020-04-27 DIAGNOSIS — U099 Post covid-19 condition, unspecified: Secondary | ICD-10-CM | POA: Insufficient documentation

## 2020-04-27 LAB — CBC
HCT: 37.2 % (ref 36.0–46.0)
Hemoglobin: 11.6 g/dL — ABNORMAL LOW (ref 12.0–15.0)
MCH: 24.7 pg — ABNORMAL LOW (ref 26.0–34.0)
MCHC: 31.2 g/dL (ref 30.0–36.0)
MCV: 79.3 fL — ABNORMAL LOW (ref 80.0–100.0)
Platelets: 367 10*3/uL (ref 150–400)
RBC: 4.69 MIL/uL (ref 3.87–5.11)
RDW: 13.8 % (ref 11.5–15.5)
WBC: 9.9 10*3/uL (ref 4.0–10.5)
nRBC: 0 % (ref 0.0–0.2)

## 2020-04-27 LAB — BASIC METABOLIC PANEL
Anion gap: 9 (ref 5–15)
BUN: 7 mg/dL (ref 6–20)
CO2: 22 mmol/L (ref 22–32)
Calcium: 8.9 mg/dL (ref 8.9–10.3)
Chloride: 106 mmol/L (ref 98–111)
Creatinine, Ser: 0.54 mg/dL (ref 0.44–1.00)
GFR, Estimated: >60 mL/min (ref >60)
Glucose, Bld: 94 mg/dL (ref 70–99)
Potassium: 3.8 mmol/L (ref 3.5–5.1)
Sodium: 137 mmol/L (ref 135–145)

## 2020-04-27 LAB — D-DIMER, QUANTITATIVE: D-Dimer, Quant: 0.63 ug/mL-FEU — ABNORMAL HIGH (ref 0.00–0.50)

## 2020-04-27 LAB — TROPONIN I (HIGH SENSITIVITY)
Troponin I (High Sensitivity): 2 ng/L (ref <18)
Troponin I (High Sensitivity): 4 ng/L (ref <18)

## 2020-04-27 LAB — I-STAT BETA HCG BLOOD, ED (MC, WL, AP ONLY): I-stat hCG, quantitative: <5 m[IU]/mL (ref <5)

## 2020-04-27 MED ORDER — IOHEXOL 350 MG/ML SOLN
75.0000 mL | Freq: Once | INTRAVENOUS | Status: AC | PRN
  Administered 2020-04-27: 75 mL via INTRAVENOUS

## 2020-04-27 NOTE — ED Provider Notes (Signed)
  Physical Exam  BP 107/72   Pulse 64   Temp 98.1 F (36.7 C)   Resp 12   SpO2 98%   Physical Exam  ED Course/Procedures   Clinical Course as of 04/27/20 1554  Sun Apr 27, 2020  1547 D-Dimer, Sharene Butters(!): 0.63 [JS]    Clinical Course User Index [JS] Claude Manges, PA-C    Procedures  MDM   Pt signed out to me by Merwyn Katos, PA-C.  Please see previous notes for further history.  In brief, patient presented for evaluation of chest pain.  She had COVID 2 weeks ago.  Troponin is normal x2.  PERC negative.  However, on further investigation, mom died of PE at 41, unknown if was provoked.  Dimer was ordered.  Slightly elevated at 0.63.  CTA is pending.  If negative, likely post Covid chest discomfort/lung inflammation, can be treated symptomatically at home.    CTA negative for acute findings.  At this time, patient appears safe for discharge.  Return precautions given.  Patient states she understands and agrees to plan    Alveria Apley, PA-C 04/27/20 1938    Milagros Loll, MD 04/28/20 1239

## 2020-04-27 NOTE — ED Triage Notes (Signed)
Pt reports COVID + 2 1/2 weeks ago.  C/o L sided chest tightness.  Denies SOB but states she believes her breathing is different.

## 2020-04-27 NOTE — Discharge Instructions (Addendum)
Your laboratory results are within normal limits today.  Symptoms should likely resolve within the next few weeks.  If you experience any worsening shortness of breath, chest pain, worsening symptoms please return to the emergency department.

## 2020-04-27 NOTE — ED Notes (Signed)
Pt returned from CT scan.

## 2020-04-27 NOTE — ED Provider Notes (Signed)
[facility] EMERGENCY DEPARTMENT Provider Note   CSN: [id number] Arrival date & time: 04/27/20  1237     History Chief Complaint  Patient presents with  . Chest Pain    Carla Walker is a 30 y.o. female.  30 y.o female with a PMH of Anemia presents to the ED with a chief complaint of S tightness which began last night.  Patient reports she is post COVID-19 infection, this occurred 2-1/2 weeks ago.  She describes this as a pressure like "someone sitting on my chest ".  She has been treating her symptoms with Mucinex, albuterol pump but does report not much improvement in the chest tightness.  She does endorse a cough, with a clear white phlegm.  States most of her symptoms have subsided after her infection, however this chest tightness has persisted.  She does not have any prior history of hypertension, diabetes, cardiac disease.  She does have a family history of prior pulmonary embolisms, according to her mother died at 44 years old from a PE.  Fevers, no shortness of breath, no other complaints.  The history is provided by the patient and medical records.  Chest Pain Pain location:  Substernal area Pain quality: pressure   Pain radiates to:  L shoulder Pain severity:  Mild Duration:  4 days Timing:  Constant Progression:  Unchanged Chronicity:  New Relieved by:  Rest Worsened by:  Movement Ineffective treatments:  None tried Associated symptoms: cough   Associated symptoms: no abdominal pain, no fever, no headache, no nausea, no shortness of breath and no vomiting   Risk factors: no birth control, no coronary artery disease, no diabetes mellitus, no high cholesterol, no hypertension, not female, no prior DVT/PE and no smoking        Past Medical History:  Diagnosis Date  . Anemia     Patient Active Problem List   Diagnosis Date Noted  . Cesarean delivery delivered 03/09/2018  . Encounter for maternal care for low transverse scar from repeat cesarean  delivery 03/09/2018    Past Surgical History:  Procedure Laterality Date  . CESAREAN SECTION    . CESAREAN SECTION WITH BILATERAL TUBAL LIGATION N/A 03/09/2018   Procedure: CESAREAN SECTION WITH BILATERAL TUBAL LIGATION;  Surgeon: Philip Aspen, DO;  Location: [facility];  Service: Obstetrics;  Laterality: N/A;  . HERNIA REPAIR    . TONSILLECTOMY       OB History    Gravida  5   Para  3   Term  3   Preterm      AB  2   Living  3     SAB      IAB  2   Ectopic      Multiple  0   Live Births  3           Family History  Problem Relation Age of Onset  . Diabetes Father   . Hypertension Father   . Diabetes Paternal Uncle   . Hypertension Paternal Uncle   . Diabetes Paternal Grandfather   . Hypertension Paternal Grandfather     Social History   Tobacco Use  . Smoking status: Never Smoker  . Smokeless tobacco: Never Used  Vaping Use  . Vaping Use: Never used  Substance Use Topics  . Alcohol use: Yes    Comment: occasionally   . Drug use: No    Home Medications Prior to Admission medications   Medication Sig Start Date End Date Taking?  Authorizing Provider  albuterol (VENTOLIN HFA) 108 (90 Base) MCG/ACT inhaler Inhale 1-2 puffs into the lungs every 6 (six) hours as needed for wheezing or shortness of breath. Patient taking differently: Inhale 1-2 puffs into the lungs as needed for wheezing or shortness of breath. 02/23/19  Yes Yu, Amy V, PA-C  LOMAIRA 8 MG TABS Take 8 mg by mouth daily. 12/22/19  Yes [provider]  topiramate (TOPAMAX) 50 MG tablet Take 50 mg by mouth daily. 12/19/19  Yes [provider]    Allergies    Patient has no known allergies.  Review of Systems   Review of Systems  Constitutional: Negative for chills and fever.  HENT: Negative for sore throat.   Respiratory: Positive for cough and chest tightness. Negative for shortness of breath.   Cardiovascular: Positive for chest pain. Negative for leg  swelling.  Gastrointestinal: Negative for abdominal pain, nausea and vomiting.  Genitourinary: Negative for flank pain.  Neurological: Negative for light-headedness and headaches.  All other systems reviewed and are negative.   Physical Exam Updated Vital Signs BP 107/72   Pulse 64   Temp 98.1 F (36.7 C)   Resp 12   SpO2 98%   Physical Exam Vitals and nursing note reviewed.  Constitutional:      Appearance: She is well-developed.  HENT:     Head: Normocephalic and atraumatic.  Cardiovascular:     Rate and Rhythm: Normal rate.     Heart sounds: Normal heart sounds.  Pulmonary:     Effort: Pulmonary effort is normal.     Breath sounds: No decreased breath sounds or wheezing.  Musculoskeletal:     Right lower leg: No tenderness. No edema.     Left lower leg: No tenderness. No edema.  Skin:    General: Skin is warm and dry.  Neurological:     Mental Status: She is alert and oriented to person, place, and time.     ED Results / Procedures / Treatments   Labs (all labs ordered are listed, but only abnormal results are displayed) Labs Reviewed  CBC - Abnormal; Notable for the following components:      Result Value   Hemoglobin 11.6 (*)    MCV 79.3 (*)    MCH 24.7 (*)    All other components within normal limits  D-DIMER, QUANTITATIVE (NOT AT [facility]) - Abnormal; Notable for the following components:   D-Dimer, Quant 0.63 (*)    All other components within normal limits  BASIC METABOLIC PANEL  I-STAT BETA HCG BLOOD, ED ([facility], [facility], [facility] ONLY)  TROPONIN I (HIGH SENSITIVITY)  TROPONIN I (HIGH SENSITIVITY)    EKG EKG Interpretation  Date/Time:  Sunday April 27 2020 12:53:44 EST Ventricular Rate:  81 PR Interval:  146 QRS Duration: 84 QT Interval:  374 QTC Calculation: 434 R Axis:   25 Text Interpretation: Normal sinus rhythm Nonspecific T wave abnormality Abnormal ECG Confirmed by Marianna Fuss ([id number]) on 04/27/2020 1:47:51 PM   Radiology DG Chest 2  View  Result Date: 04/27/2020 CLINICAL DATA:  Chest pain. EXAM: CHEST - 2 VIEW COMPARISON:  09/26/2019 FINDINGS: The cardiac silhouette, mediastinal and hilar contours are normal. The lungs are clear. No pleural effusions. No pulmonary lesions. The bony thorax is intact. IMPRESSION: No acute cardiopulmonary findings. Electronically Signed   By: Rudie Meyer M.D.   On: 04/27/2020 13:16    Procedures Procedures   Medications Ordered in ED Medications - No data to display  ED Course  I have reviewed the triage vital signs and the nursing notes.  Pertinent labs & imaging results that were available during my care of the patient were reviewed by me and considered in my medical decision making (see chart for details).  Clinical Course as of 04/27/20 1555  Sun Apr 27, 2020  1547 D-Dimer, Sharene Butters(!): 0.63 [JS]    Clinical Course User Index [JS] Claude Manges, PA-C   MDM Rules/Calculators/A&P     Patient with a past medical history presents to the ED with a chief complaint of chest tightness which began a couple days ago.  She is currently post COVID-19 infection, reports her symptoms have improved which consistent with fever, cough, gesturing.  She has been taken Mucinex, butyryl inhaler with improvement.  However, she reports the chest tightness began to feel worse this morning.  Reports this tightness is improving with rest however worse on exertion.  Reports continued cough with white phlegm.  Has not been running any fevers.  Evaluation patient is overall well-appearing, there is no pain with palpation of her chest, lungs are clear to auscultation without any diminished sounds or wheezing noted.  There is no bilateral calf tenderness or bilateral pitting edema.  Vitals are within normal limits, no signs of tachypnea, tachycardia or hypoxia.  Interpretation of her labs revealed a CBC with no leukocytosis, hemoglobin is within normal limits.  BMP doubt any electrolyte abnormality, creatinine  level is within normal limits.  hCG is negative.  First troponin is negative, EKG without any signs consistent with infarct..   Chest x-ray without any signs of pneumonia, or pneumothorax.  She does not have any prior history of CAD, currently on no medication for hypertension, diabetes or cholesterol.  Does have mother who passed away from a pulmonary embolism, unknown whether was provoked or unprovoked.  She was previously placed on OCPs, however discontinued these several years ago after PCP "found that my mother passed from a PE ".  PERC negative.   We discussed obtaining D-dimer level on today's visit, risks and benefits of CT imaging were discussed at length with patient.  Will await delta Trope along with dimer at this time.  3:34 PM Call placed to lab in order to check dimer and troponin, currently still pending.   3:47 PM D-dimer level elevate at 0.63, we discussed risks/benefits to CT Angio, patient agreeable of further evaluation.  Care signed out to incoming provider pending CTA.     Portions of this note were generated with Scientist, clinical (histocompatibility and immunogenetics). Dictation errors may occur despite best attempts at proofreading.  Final Clinical Impression(s) / ED Diagnoses Final diagnoses:  Atypical chest pain    Rx / DC Orders ED Discharge Orders    None       Claude Manges, PA-C 04/27/20 1555    Milagros Loll, MD 04/27/20 1723

## 2020-04-27 NOTE — ED Notes (Signed)
Patient transported to X-ray 

## 2021-02-11 ENCOUNTER — Encounter: Payer: Self-pay | Admitting: Emergency Medicine

## 2021-02-11 ENCOUNTER — Ambulatory Visit
Admission: EM | Admit: 2021-02-11 | Discharge: 2021-02-11 | Disposition: A | Discharge status: Home / Self Care | Payer: 59

## 2021-02-11 ENCOUNTER — Other Ambulatory Visit: Payer: Self-pay

## 2021-02-11 DIAGNOSIS — R0789 Other chest pain: Secondary | ICD-10-CM | POA: Diagnosis not present

## 2021-02-11 NOTE — ED Provider Notes (Signed)
[facility]    CSN: [id number] Arrival date & time: 02/11/21  1835      History   Chief Complaint Chief Complaint  Patient presents with   Chest Pain    HPI Carla Walker is a 30 y.o. female.   Patient here today for evaluation of intermittent right sided chest discomfort. She reports that pain will occur to her mid chest at times and other times to her right chest. She is unaware of any triggers. She denies any shortness of breath, lightheadedness or nausea/vomiting. Patient is more concerned because her mother passed from PE.   The history is provided by the patient.  Chest Pain Associated symptoms: no abdominal pain, no fever, no nausea, no palpitations, no shortness of breath and no vomiting    Past Medical History:  Diagnosis Date   Anemia     Patient Active Problem List   Diagnosis Date Noted   Cesarean delivery delivered 03/09/2018   Encounter for maternal care for low transverse scar from repeat cesarean delivery 03/09/2018    Past Surgical History:  Procedure Laterality Date   CESAREAN SECTION     CESAREAN SECTION WITH BILATERAL TUBAL LIGATION N/A 03/09/2018   Procedure: CESAREAN SECTION WITH BILATERAL TUBAL LIGATION;  Surgeon: Philip Aspen, DO;  Location: [facility];  Service: Obstetrics;  Laterality: N/A;   HERNIA REPAIR     TONSILLECTOMY      OB History     Gravida  5   Para  3   Term  3   Preterm      AB  2   Living  3      SAB      IAB  2   Ectopic      Multiple  0   Live Births  3            Home Medications    Prior to Admission medications   Medication Sig Start Date End Date Taking? Authorizing Provider  albuterol (VENTOLIN HFA) 108 (90 Base) MCG/ACT inhaler Inhale 1-2 puffs into the lungs every 6 (six) hours as needed for wheezing or shortness of breath. Patient taking differently: Inhale 1-2 puffs into the lungs as needed for wheezing or shortness of breath. 02/23/19   Yu, Amy V, PA-C   LOMAIRA 8 MG TABS Take 8 mg by mouth daily. 12/22/19   [provider]  topiramate (TOPAMAX) 50 MG tablet Take 50 mg by mouth daily. 12/19/19   [provider]    Family History Family History  Problem Relation Age of Onset   Diabetes Father    Hypertension Father    Diabetes Paternal Uncle    Hypertension Paternal Uncle    Diabetes Paternal Grandfather    Hypertension Paternal Grandfather     Social History Social History   Tobacco Use   Smoking status: Never   Smokeless tobacco: Never  Vaping Use   Vaping Use: Never used  Substance Use Topics   Alcohol use: Yes    Comment: occasionally    Drug use: No     Allergies   Patient has no known allergies.   Review of Systems Review of Systems  Constitutional:  Negative for chills and fever.  Eyes:  Negative for discharge and redness.  Respiratory:  Negative for shortness of breath.   Cardiovascular:  Positive for chest pain. Negative for palpitations.  Gastrointestinal:  Negative for abdominal pain, nausea and vomiting.  Genitourinary:  Positive for vaginal bleeding and vaginal discharge.  Neurological:  Negative for light-headedness.    Physical Exam Triage Vital Signs ED Triage Vitals  Enc Vitals Group     BP 02/11/21 1903 118/86     Pulse Rate 02/11/21 1903 71     Resp 02/11/21 1903 16     Temp 02/11/21 1903 98.1 F (36.7 C)     Temp Source 02/11/21 1903 Oral     SpO2 02/11/21 1903 96 %     Weight --      Height --      Head Circumference --      Peak Flow --      Pain Score 02/11/21 1902 0     Pain Loc --      Pain Edu? --      Excl. in GC? --    No data found.  Updated Vital Signs BP 118/86 (BP Location: Left Arm)   Pulse 71   Temp 98.1 F (36.7 C) (Oral)   Resp 16   SpO2 96%      Physical Exam Vitals and nursing note reviewed.  Constitutional:      General: She is not in acute distress.    Appearance: Normal appearance. She is not ill-appearing.  HENT:     Head:  Normocephalic and atraumatic.  Eyes:     Conjunctiva/sclera: Conjunctivae normal.  Cardiovascular:     Rate and Rhythm: Normal rate and regular rhythm.     Heart sounds: Normal heart sounds. No murmur heard. Pulmonary:     Effort: Pulmonary effort is normal. No respiratory distress.     Breath sounds: Normal breath sounds. No wheezing, rhonchi or rales.  Neurological:     Mental Status: She is alert.  Psychiatric:        Mood and Affect: Mood normal.        Behavior: Behavior normal.        Thought Content: Thought content normal.     UC Treatments / Results  Labs (all labs ordered are listed, but only abnormal results are displayed) Labs Reviewed - No data to display  EKG   Radiology No results found.  Procedures Procedures (including critical care time)  Medications Ordered in UC Medications - No data to display  Initial Impression / Assessment and Plan / UC Course  I have reviewed the triage vital signs and the nursing notes.  Pertinent labs & imaging results that were available during my care of the patient were reviewed by me and considered in my medical decision making (see chart for details).   Reassured patient that her chest pain did not seem to be cardiac in nature and she was not showing signs/ symptoms consistent with PE. Recommended further evaluation by PCP as she may need outpatient imaging, etc. Patient expresses understanding. Encouraged follow up with any further concerns.   Final Clinical Impressions(s) / UC Diagnoses   Final diagnoses:  Atypical chest pain   Discharge Instructions   None    ED Prescriptions   None    PDMP not reviewed this encounter.   Tomi Bamberger, PA-C 02/12/21 [phone]

## 2021-02-11 NOTE — ED Triage Notes (Addendum)
Intermittent sporadic chest pains occurring over the last week. Says it's very random, no identifiable triggers. Says once it comes on it stays for a while. Says its usually central or to the right side of the chest. Reports working, going to school, and taking care of the kids. Denies feeling heart beating abnormally, nausea, shortness of breath during episodes. Says food has recently been upsetting her stomach more. Not currently having pain. Reports her mother died of a PE. Works at an office job

## 2021-04-20 ENCOUNTER — Ambulatory Visit
Admission: EM | Admit: 2021-04-20 | Discharge: 2021-04-20 | Disposition: A | Discharge status: Home / Self Care | Payer: BC Managed Care – PPO | Attending: Internal Medicine | Admitting: Internal Medicine

## 2021-04-20 ENCOUNTER — Encounter: Payer: Self-pay | Admitting: Emergency Medicine

## 2021-04-20 ENCOUNTER — Other Ambulatory Visit: Payer: Self-pay

## 2021-04-20 DIAGNOSIS — J029 Acute pharyngitis, unspecified: Secondary | ICD-10-CM

## 2021-04-20 LAB — POCT RAPID STREP A (OFFICE): Rapid Strep A Screen: NEGATIVE

## 2021-04-20 MED ORDER — AMOXICILLIN 500 MG PO CAPS: 500.0000 mg | ORAL_CAPSULE | Freq: Two times a day (BID) | ORAL | 0 refills | Status: AC

## 2021-04-20 NOTE — ED Provider Notes (Signed)
[facility]    CSN: [id number] Arrival date & time: 04/20/21  0935      History   Chief Complaint Chief Complaint  Patient presents with   Sore Throat    HPI Carla Walker is a 31 y.o. female.   Patient presents with sore throat, ear pain, mild nonproductive cough that has been present for approximately 2 days.  Ear pain is present in the right ear but not on the left.  Denies any known fevers.  Her son currently has strep throat.  Denies chest pain, shortness of breath, nasal congestion, nausea, vomiting, diarrhea, abdominal pain.   Sore Throat   Past Medical History:  Diagnosis Date   Anemia     Patient Active Problem List   Diagnosis Date Noted   Cesarean delivery delivered 03/09/2018   Encounter for maternal care for low transverse scar from repeat cesarean delivery 03/09/2018    Past Surgical History:  Procedure Laterality Date   CESAREAN SECTION     CESAREAN SECTION WITH BILATERAL TUBAL LIGATION N/A 03/09/2018   Procedure: CESAREAN SECTION WITH BILATERAL TUBAL LIGATION;  Surgeon: Philip Aspen, DO;  Location: [facility];  Service: Obstetrics;  Laterality: N/A;   HERNIA REPAIR     TONSILLECTOMY      OB History     Gravida  5   Para  3   Term  3   Preterm      AB  2   Living  3      SAB      IAB  2   Ectopic      Multiple  0   Live Births  3            Home Medications    Prior to Admission medications   Medication Sig Start Date End Date Taking? Authorizing Provider  amoxicillin (AMOXIL) 500 MG capsule Take 1 capsule (500 mg total) by mouth 2 (two) times daily for 10 days. 04/20/21 04/30/21 Yes Karole Oo, Acie Fredrickson, FNP  clindamycin (CLEOCIN) 300 MG capsule Take 300 mg by mouth 2 (two) times daily. 04/16/21  Yes [provider]  albuterol (VENTOLIN HFA) 108 (90 Base) MCG/ACT inhaler Inhale 1-2 puffs into the lungs every 6 (six) hours as needed for wheezing or shortness of breath. Patient taking  differently: Inhale 1-2 puffs into the lungs as needed for wheezing or shortness of breath. 02/23/19   Yu, Amy V, PA-C  LOMAIRA 8 MG TABS Take 8 mg by mouth daily. 12/22/19   [provider]  topiramate (TOPAMAX) 50 MG tablet Take 50 mg by mouth daily. 12/19/19   [provider]    Family History Family History  Problem Relation Age of Onset   Diabetes Father    Hypertension Father    Diabetes Paternal Uncle    Hypertension Paternal Uncle    Diabetes Paternal Grandfather    Hypertension Paternal Grandfather     Social History Social History   Tobacco Use   Smoking status: Never   Smokeless tobacco: Never  Vaping Use   Vaping Use: Never used  Substance Use Topics   Alcohol use: Yes    Comment: occasionally    Drug use: No     Allergies   Patient has no known allergies.   Review of Systems Review of Systems Per HPI  Physical Exam Triage Vital Signs ED Triage Vitals [04/20/21 1145]  Enc Vitals Group     BP (!) 140/95     Pulse Rate  85     Resp 18     Temp 99.1 F (37.3 C)     Temp Source Oral     SpO2 95 %     Weight 259 lb 14.8 oz (117.9 kg)     Height 5\' 7"  (1.702 m)     Head Circumference      Peak Flow      Pain Score 7     Pain Loc      Pain Edu?      Excl. in GC?    No data found.  Updated Vital Signs BP (!) 140/95 (BP Location: Left Arm)    Pulse 85    Temp 99.1 F (37.3 C) (Oral)    Resp 18    Ht 5\' 7"  (1.702 m)    Wt 259 lb 14.8 oz (117.9 kg)    LMP 04/17/2021    SpO2 95%    BMI 40.71 kg/m   Visual Acuity Right Eye Distance:   Left Eye Distance:   Bilateral Distance:    Right Eye Near:   Left Eye Near:    Bilateral Near:     Physical Exam Constitutional:      General: She is not in acute distress.    Appearance: Normal appearance. She is not toxic-appearing or diaphoretic.  HENT:     Head: Normocephalic and atraumatic.     Right Ear: Tympanic membrane, ear canal and external ear normal. No drainage, swelling or  tenderness. No middle ear effusion. No mastoid tenderness. Tympanic membrane is not perforated, erythematous or bulging.     Left Ear: Tympanic membrane, ear canal and external ear normal. No drainage, swelling or tenderness.  No middle ear effusion. No mastoid tenderness. Tympanic membrane is not perforated, erythematous or bulging.     Nose: Nose normal.     Mouth/Throat:     Lips: Pink.     Mouth: Mucous membranes are moist.     Pharynx: Posterior oropharyngeal erythema present. No pharyngeal swelling or oropharyngeal exudate.     Tonsils: No tonsillar exudate or tonsillar abscesses.     Comments: Very hard to visualize patient's posterior pharynx and tonsils given that tongue is in the way.  Obtained and used a tongue depressor that did not provide much help given patient's cooperation.  Eyes:     Extraocular Movements: Extraocular movements intact.     Conjunctiva/sclera: Conjunctivae normal.  Cardiovascular:     Rate and Rhythm: Normal rate and regular rhythm.     Pulses: Normal pulses.     Heart sounds: Normal heart sounds.  Pulmonary:     Effort: Pulmonary effort is normal. No respiratory distress.     Breath sounds: Normal breath sounds.  Neurological:     General: No focal deficit present.     Mental Status: She is alert and oriented to person, place, and time. Mental status is at baseline.  Psychiatric:        Mood and Affect: Mood normal.        Behavior: Behavior normal.        Thought Content: Thought content normal.        Judgment: Judgment normal.     UC Treatments / Results  Labs (all labs ordered are listed, but only abnormal results are displayed) Labs Reviewed  CULTURE, GROUP A STREP ([facility])  NOVEL CORONAVIRUS, NAA  POCT RAPID STREP A (OFFICE)    EKG   Radiology No results found.  Procedures Procedures (including critical care time)  Medications Ordered in UC Medications - No data to display  Initial Impression / Assessment and Plan / UC Course   I have reviewed the triage vital signs and the nursing notes.  Pertinent labs & imaging results that were available during my care of the patient were reviewed by me and considered in my medical decision making (see chart for details).     Rapid strep test was negative but still suspicious of strep throat given patient's close exposure.  Unable to completely visualize posterior pharynx and tonsils on exam given patient's cooperation and her tongue being in the way.  Will opt to treat with amoxicillin antibiotic.  Throat culture and COVID-19 viral are pending.  Discussed supportive care and symptom management.  Discussed strict return precautions.  Patient verbalized understanding and was agreeable with plan. Final Clinical Impressions(s) / UC Diagnoses   Final diagnoses:  Sore throat     Discharge Instructions      Your rapid strep was negative but still suspicious of strep throat given your close exposure.  You are being treated for this with amoxicillin antibiotic.  COVID test is also pending.  We will call if it is positive.     ED Prescriptions     Medication Sig Dispense Auth. Provider   amoxicillin (AMOXIL) 500 MG capsule Take 1 capsule (500 mg total) by mouth 2 (two) times daily for 10 days. 20 capsule Gustavus Bryant, [place]      PDMP not reviewed this encounter.   Gustavus Bryant, [place] 04/20/21 1242

## 2021-04-20 NOTE — ED Triage Notes (Signed)
Patient c/o sore throat, facial pain, ear pain x 2 days.  Home COVID test negative, taken [facility].

## 2021-04-20 NOTE — Discharge Instructions (Signed)
Your rapid strep was negative but still suspicious of strep throat given your close exposure.  You are being treated for this with amoxicillin antibiotic.  COVID test is also pending.  We will call if it is positive.

## 2021-04-21 LAB — SARS-COV-2, NAA 2 DAY TAT

## 2021-04-21 LAB — NOVEL CORONAVIRUS, NAA: SARS-CoV-2, NAA: NOT DETECTED

## 2021-04-23 LAB — CULTURE, GROUP A STREP (THRC)

## 2021-07-11 ENCOUNTER — Other Ambulatory Visit: Payer: Self-pay

## 2021-07-11 ENCOUNTER — Emergency Department (HOSPITAL_COMMUNITY): Payer: BC Managed Care – PPO

## 2021-07-11 ENCOUNTER — Encounter (HOSPITAL_COMMUNITY): Payer: Self-pay | Admitting: Emergency Medicine

## 2021-07-11 ENCOUNTER — Emergency Department (HOSPITAL_COMMUNITY)
Admission: EM | Admit: 2021-07-11 | Discharge: 2021-07-11 | Discharge status: Against Medical Advice | Payer: BC Managed Care – PPO | Attending: Emergency Medicine | Admitting: Emergency Medicine

## 2021-07-11 DIAGNOSIS — R072 Precordial pain: Secondary | ICD-10-CM | POA: Diagnosis present

## 2021-07-11 DIAGNOSIS — Z5321 Procedure and treatment not carried out due to patient leaving prior to being seen by health care provider: Secondary | ICD-10-CM | POA: Insufficient documentation

## 2021-07-11 LAB — CBC
HCT: 36.6 % (ref 36.0–46.0)
Hemoglobin: 11.8 g/dL — ABNORMAL LOW (ref 12.0–15.0)
MCH: 25.5 pg — ABNORMAL LOW (ref 26.0–34.0)
MCHC: 32.2 g/dL (ref 30.0–36.0)
MCV: 79.2 fL — ABNORMAL LOW (ref 80.0–100.0)
Platelets: 396 10*3/uL (ref 150–400)
RBC: 4.62 MIL/uL (ref 3.87–5.11)
RDW: 14.2 % (ref 11.5–15.5)
WBC: 9.9 10*3/uL (ref 4.0–10.5)
nRBC: 0 % (ref 0.0–0.2)

## 2021-07-11 LAB — BASIC METABOLIC PANEL
Anion gap: 8 (ref 5–15)
BUN: 10 mg/dL (ref 6–20)
CO2: 23 mmol/L (ref 22–32)
Calcium: 9.2 mg/dL (ref 8.9–10.3)
Chloride: 106 mmol/L (ref 98–111)
Creatinine, Ser: 0.64 mg/dL (ref 0.44–1.00)
GFR, Estimated: >60 mL/min (ref >60)
Glucose, Bld: 102 mg/dL — ABNORMAL HIGH (ref 70–99)
Potassium: 3.8 mmol/L (ref 3.5–5.1)
Sodium: 137 mmol/L (ref 135–145)

## 2021-07-11 LAB — TROPONIN I (HIGH SENSITIVITY)
Troponin I (High Sensitivity): 2 ng/L (ref <18)
Troponin I (High Sensitivity): 3 ng/L (ref <18)

## 2021-07-11 LAB — I-STAT BETA HCG BLOOD, ED (MC, WL, AP ONLY): I-stat hCG, quantitative: <5 m[IU]/mL (ref <5)

## 2021-07-11 NOTE — ED Triage Notes (Signed)
Pt expresses concerns for blood clots d/t family history of blood clots. States her mother passed away from the same. Pt continually denies shortness of breath, but states any time she feels pain she should be evaluated.  ?

## 2021-07-11 NOTE — ED Triage Notes (Signed)
Pt reported to ED with c/o midsternal chest pain that occurs intermittently since March. Pt cannot state what the triggering event was to be evaluated today, states she was hurting at work and she needs to know what is going on. States at times pain radiates down into left arm. Denies any shortness of breath or nausea/vomiting.  ?

## 2021-07-12 ENCOUNTER — Emergency Department (HOSPITAL_BASED_OUTPATIENT_CLINIC_OR_DEPARTMENT_OTHER)
Admission: EM | Admit: 2021-07-12 | Discharge: 2021-07-12 | Disposition: A | Discharge status: Home / Self Care | Payer: BC Managed Care – PPO | Attending: Emergency Medicine | Admitting: Emergency Medicine

## 2021-07-12 ENCOUNTER — Emergency Department (HOSPITAL_BASED_OUTPATIENT_CLINIC_OR_DEPARTMENT_OTHER): Payer: BC Managed Care – PPO

## 2021-07-12 ENCOUNTER — Encounter (HOSPITAL_BASED_OUTPATIENT_CLINIC_OR_DEPARTMENT_OTHER): Payer: Self-pay | Admitting: Urology

## 2021-07-12 DIAGNOSIS — R791 Abnormal coagulation profile: Secondary | ICD-10-CM | POA: Diagnosis not present

## 2021-07-12 DIAGNOSIS — R0602 Shortness of breath: Secondary | ICD-10-CM | POA: Insufficient documentation

## 2021-07-12 DIAGNOSIS — R0789 Other chest pain: Secondary | ICD-10-CM | POA: Insufficient documentation

## 2021-07-12 DIAGNOSIS — R072 Precordial pain: Secondary | ICD-10-CM | POA: Diagnosis present

## 2021-07-12 LAB — BASIC METABOLIC PANEL
Anion gap: 6 (ref 5–15)
BUN: 13 mg/dL (ref 6–20)
CO2: 26 mmol/L (ref 22–32)
Calcium: 9.4 mg/dL (ref 8.9–10.3)
Chloride: 105 mmol/L (ref 98–111)
Creatinine, Ser: 0.61 mg/dL (ref 0.44–1.00)
GFR, Estimated: >60 mL/min (ref >60)
Glucose, Bld: 99 mg/dL (ref 70–99)
Potassium: 4.1 mmol/L (ref 3.5–5.1)
Sodium: 137 mmol/L (ref 135–145)

## 2021-07-12 LAB — HEPATIC FUNCTION PANEL
ALT: 14 U/L (ref 0–44)
AST: 20 U/L (ref 15–41)
Albumin: 3.9 g/dL (ref 3.5–5.0)
Alkaline Phosphatase: 75 U/L (ref 38–126)
Bilirubin, Direct: 0.2 mg/dL (ref 0.0–0.2)
Indirect Bilirubin: 0.2 mg/dL — ABNORMAL LOW (ref 0.3–0.9)
Total Bilirubin: 0.4 mg/dL (ref 0.3–1.2)
Total Protein: 7.2 g/dL (ref 6.5–8.1)

## 2021-07-12 LAB — CBC
HCT: 36.7 % (ref 36.0–46.0)
Hemoglobin: 11.9 g/dL — ABNORMAL LOW (ref 12.0–15.0)
MCH: 25.5 pg — ABNORMAL LOW (ref 26.0–34.0)
MCHC: 32.4 g/dL (ref 30.0–36.0)
MCV: 78.8 fL — ABNORMAL LOW (ref 80.0–100.0)
Platelets: 432 10*3/uL — ABNORMAL HIGH (ref 150–400)
RBC: 4.66 MIL/uL (ref 3.87–5.11)
RDW: 14.3 % (ref 11.5–15.5)
WBC: 9.8 10*3/uL (ref 4.0–10.5)
nRBC: 0 % (ref 0.0–0.2)

## 2021-07-12 LAB — LIPASE, BLOOD: Lipase: 23 U/L (ref 11–51)

## 2021-07-12 LAB — PREGNANCY, URINE: Preg Test, Ur: NEGATIVE

## 2021-07-12 LAB — D-DIMER, QUANTITATIVE: D-Dimer, Quant: 1.2 ug/mL-FEU — ABNORMAL HIGH (ref 0.00–0.50)

## 2021-07-12 LAB — TROPONIN I (HIGH SENSITIVITY)
Troponin I (High Sensitivity): <2 ng/L (ref <18)
Troponin I (High Sensitivity): <2 ng/L (ref <18)

## 2021-07-12 MED ORDER — IOHEXOL 350 MG/ML SOLN
100.0000 mL | Freq: Once | INTRAVENOUS | Status: AC | PRN
  Administered 2021-07-12: 75 mL via INTRAVENOUS

## 2021-07-12 NOTE — ED Provider Notes (Signed)
?[facility] ?Provider Note ? ? ?CSN: [id number] ?Arrival date & time: 07/12/21  1731 ? ?  ? ?History ? ?Chief Complaint  ?Patient presents with  ? Chest Pain  ? ? ?Carla Walker is a 31 y.o. female. ? ?The history is provided by the patient and medical records. No language interpreter was used.  ?Chest Pain ?Pain location:  Substernal area ?Pain quality: aching, pressure and tightness   ?Pain radiates to:  R arm ?Pain severity:  Moderate ?Onset quality:  Gradual ?Duration:  2 months ?Timing:  Intermittent ?Progression:  Waxing and waning ?Chronicity:  Chronic ?Context: not trauma   ?Relieved by:  Nothing ?Worsened by:  Nothing ?Ineffective treatments:  None tried ?Associated symptoms: no abdominal pain, no altered mental status, no back pain, no cough, no diaphoresis, no dizziness, no fatigue, no fever, no headache, no lower extremity edema, no nausea, no near-syncope, no numbness, no palpitations, no shortness of breath (not currently), no vomiting and no weakness   ?Risk factors: no coronary artery disease   ? ?  ? ?Home Medications ?Prior to Admission medications   ?Medication Sig Start Date End Date Taking? Authorizing Provider  ?albuterol (VENTOLIN HFA) 108 (90 Base) MCG/ACT inhaler Inhale 1-2 puffs into the lungs every 6 (six) hours as needed for wheezing or shortness of breath. ?Patient taking differently: Inhale 1-2 puffs into the lungs as needed for wheezing or shortness of breath. 02/23/19   Cathie Hoops, Amy V, PA-C  ?clindamycin (CLEOCIN) 300 MG capsule Take 300 mg by mouth 2 (two) times daily. 04/16/21   [provider]  ?LOMAIRA 8 MG TABS Take 8 mg by mouth daily. 12/22/19   [provider]  ?topiramate (TOPAMAX) 50 MG tablet Take 50 mg by mouth daily. 12/19/19   [provider]  ?   ? ?Allergies    ?Patient has no known allergies.   ? ?Review of Systems   ?Review of Systems  ?Constitutional:  Negative for chills, diaphoresis, fatigue and fever.  ?HENT:   Negative for congestion.   ?Respiratory:  Negative for cough, chest tightness and shortness of breath (not currently).   ?Cardiovascular:  Positive for chest pain. Negative for palpitations and near-syncope.  ?Gastrointestinal:  Negative for abdominal pain, constipation, diarrhea, nausea and vomiting.  ?Genitourinary:  Negative for dysuria and flank pain.  ?Musculoskeletal:  Negative for back pain, neck pain and neck stiffness.  ?Skin:  Negative for rash and wound.  ?Neurological:  Negative for dizziness, weakness, light-headedness, numbness and headaches.  ?Psychiatric/Behavioral:  Negative for agitation and confusion.   ?All other systems reviewed and are negative. ? ?Physical Exam ?Updated Vital Signs ?BP 119/77 (BP Location: Left Arm)   Pulse 72   Temp 97.9 ?F (36.6 ?C) (Oral)   Resp 18   Ht 5\' 7"  (1.702 m)   Wt 117.9 kg   SpO2 96%   BMI 40.71 kg/m?  ?Physical Exam ?Vitals and nursing note reviewed.  ?Constitutional:   ?   General: She is not in acute distress. ?   Appearance: She is well-developed. She is not ill-appearing, toxic-appearing or diaphoretic.  ?HENT:  ?   Head: Normocephalic and atraumatic.  ?Eyes:  ?   Conjunctiva/sclera: Conjunctivae normal.  ?Cardiovascular:  ?   Rate and Rhythm: Normal rate and regular rhythm.  ?   Heart sounds: Normal heart sounds. No murmur heard. ?Pulmonary:  ?   Effort: Pulmonary effort is normal. No respiratory distress.  ?   Breath sounds: Normal breath sounds. No  decreased breath sounds, wheezing, rhonchi or rales.  ?Chest:  ?   Chest wall: No tenderness.  ?Abdominal:  ?   Palpations: Abdomen is soft.  ?   Tenderness: There is no abdominal tenderness.  ?Musculoskeletal:     ?   General: No swelling.  ?   Cervical back: Neck supple.  ?   Right lower leg: No tenderness. No edema.  ?   Left lower leg: No tenderness. No edema.  ?Skin: ?   General: Skin is warm and dry.  ?   Capillary Refill: Capillary refill takes less than 2 seconds.  ?   Findings: No erythema.   ?Neurological:  ?   General: No focal deficit present.  ?   Mental Status: She is alert.  ?Psychiatric:     ?   Mood and Affect: Mood normal.  ? ? ?ED Results / Procedures / Treatments   ?Labs ?(all labs ordered are listed, but only abnormal results are displayed) ?Labs Reviewed  ?CBC - Abnormal; Notable for the following components:  ?    Result Value  ? Hemoglobin 11.9 (*)   ? MCV 78.8 (*)   ? MCH 25.5 (*)   ? Platelets 432 (*)   ? All other components within normal limits  ?HEPATIC FUNCTION PANEL - Abnormal; Notable for the following components:  ? Indirect Bilirubin 0.2 (*)   ? All other components within normal limits  ?D-DIMER, QUANTITATIVE - Abnormal; Notable for the following components:  ? D-Dimer, Quant 1.20 (*)   ? All other components within normal limits  ?BASIC METABOLIC PANEL  ?PREGNANCY, URINE  ?LIPASE, BLOOD  ?TROPONIN I (HIGH SENSITIVITY)  ?TROPONIN I (HIGH SENSITIVITY)  ? ? ?EKG ?EKG Interpretation ? ?Date/Time:  Sunday July 12 2021 17:42:42 EDT ?Ventricular Rate:  69 ?PR Interval:  146 ?QRS Duration: 86 ?QT Interval:  382 ?QTC Calculation: 409 ?R Axis:   17 ?Text Interpretation: Normal sinus rhythm Normal ECG When compared with ECG of 11-Jul-2021 00:47, PREVIOUS ECG IS PRESENT when compared to ECG yesterday,? less wandering baseline. No STEMI Confirmed by Theda Belfastegeler, Chris ([id number]) on 07/12/2021 8:02:59 PM ? ?Radiology ?DG Chest 2 View ? ?Result Date: 07/11/2021 ?CLINICAL DATA:  Chest pain. EXAM: CHEST - 2 VIEW COMPARISON:  April 27, 2020 FINDINGS: The heart size and mediastinal contours are within normal limits. Both lungs are clear. The visualized skeletal structures are unremarkable. IMPRESSION: No active cardiopulmonary disease. Electronically Signed   By: Aram Candelahaddeus  Houston M.D.   On: 07/11/2021 01:24  ? ?CT Angio Chest PE W and/or Wo Contrast ? ?Result Date: 07/12/2021 ?CLINICAL DATA:  Suspect PE.  Chest tightness. EXAM: CT ANGIOGRAPHY CHEST WITH CONTRAST TECHNIQUE: Multidetector CT imaging of  the chest was performed using the standard protocol during bolus administration of intravenous contrast. Multiplanar CT image reconstructions and MIPs were obtained to evaluate the vascular anatomy. RADIATION DOSE REDUCTION: This exam was performed according to the departmental dose-optimization program which includes automated exposure control, adjustment of the mA and/or kV according to patient size and/or use of iterative reconstruction technique. CONTRAST:  75mL OMNIPAQUE IOHEXOL 350 MG/ML SOLN COMPARISON:  CT angiogram chest 04/27/2020. FINDINGS: Cardiovascular: Satisfactory opacification of the pulmonary arteries to the segmental level. No evidence of pulmonary embolism. Normal heart size. No pericardial effusion. Mediastinum/Nodes: No enlarged mediastinal, hilar, or axillary lymph nodes. Thyroid gland, trachea, and esophagus demonstrate no significant findings. Lungs/Pleura: Lungs are clear. No pleural effusion or pneumothorax. Upper Abdomen: No acute abnormality. Musculoskeletal: No chest wall abnormality. No acute  or significant osseous findings. Review of the MIP images confirms the above findings. IMPRESSION: 1. No evidence for pulmonary embolism. 2. No acute cardiopulmonary process. Electronically Signed   By: Darliss Cheney M.D.   On: 07/12/2021 21:55   ? ?Procedures ?Procedures  ? ? ?Medications Ordered in ED ?Medications  ?iohexol (OMNIPAQUE) 350 MG/ML injection 100 mL (75 mLs Intravenous Contrast Given 07/12/21 2130)  ? ? ?ED Course/ Medical Decision Making/ A&P ?  ?                        ?Medical Decision Making ?Amount and/or Complexity of Data Reviewed ?Labs: ordered. ?Radiology: ordered. ? ?Risk ?Prescription drug management. ? ? ? ?Carla Walker is a 31 y.o. female with a past medical history significant for previous anemia and chronic chest pain who presents with recurrent chest pain.  According to patient, for the last 2 months she has had waxing and waning chest pain that feels like a  tightness and pressure in her central chest that occasionally goes down her arms.  She reports some shortness of breath with it at times but is not having short of breath now.  She reports her mother died at 16 from PE

## 2021-07-12 NOTE — ED Notes (Signed)
Patient transported to CT 

## 2021-07-12 NOTE — ED Notes (Signed)
Pt A&OX4 ambulatory at d/c with independent steady gait 

## 2021-07-12 NOTE — Discharge Instructions (Signed)
Your history, exam, work-up today were overall reassuring.  The cardiac enzymes were negative both times we checked them.  The CT scan did not show evidence of any abnormality specifically no blood clot.  Given your continued and ongoing chest pain it is reasonable to have you follow-up with outpatient cardiology.  Please call to schedule appointment.  If any symptoms change or worsen acutely, please turn to the nearest emergency department. ?

## 2021-07-12 NOTE — ED Triage Notes (Signed)
Pt states chest tightness intermittent x 2 months, Denies SOB or any other related symptoms  ? ?

## 2021-07-14 ENCOUNTER — Other Ambulatory Visit: Payer: Self-pay

## 2021-07-14 ENCOUNTER — Emergency Department (HOSPITAL_BASED_OUTPATIENT_CLINIC_OR_DEPARTMENT_OTHER)
Admission: EM | Admit: 2021-07-14 | Discharge: 2021-07-14 | Disposition: A | Discharge status: Home / Self Care | Payer: BC Managed Care – PPO | Attending: Emergency Medicine | Admitting: Emergency Medicine

## 2021-07-14 ENCOUNTER — Encounter (HOSPITAL_BASED_OUTPATIENT_CLINIC_OR_DEPARTMENT_OTHER): Payer: Self-pay | Admitting: Urology

## 2021-07-14 ENCOUNTER — Emergency Department (HOSPITAL_BASED_OUTPATIENT_CLINIC_OR_DEPARTMENT_OTHER): Payer: BC Managed Care – PPO

## 2021-07-14 DIAGNOSIS — M79604 Pain in right leg: Secondary | ICD-10-CM | POA: Insufficient documentation

## 2021-07-14 DIAGNOSIS — R0789 Other chest pain: Secondary | ICD-10-CM | POA: Diagnosis not present

## 2021-07-14 DIAGNOSIS — I2510 Atherosclerotic heart disease of native coronary artery without angina pectoris: Secondary | ICD-10-CM | POA: Insufficient documentation

## 2021-07-14 NOTE — Discharge Instructions (Signed)
Please keep your appointment with your PCP in 2 days time for further evaluation of your ongoing symptoms.  ? ?You should also make an appointment with cardiology for further evaluation of your chest pains.  ? ?Take Ibuprofen and Tylenol as needed for pain.  ? ?Return to the ED for any new/worsening symptoms ?

## 2021-07-14 NOTE — ED Provider Notes (Signed)
?[facility] EMERGENCY DEPARTMENT ?Provider Note ? ? ?CSN: [id number] ?Arrival date & time: 07/14/21  1524 ? ?  ? ?History ? ?Chief Complaint  ?Patient presents with  ? Leg Pain  ? ? ?Carla Walker is a 31 y.o. female with PMHx anemia and Fhx PE/CAD who presents to the ED today with complaint of gradual onset, constant, atraumatic, achy, RLE calf pain that began a couple of weeks ago. Pt reports worsening pain over the past few days. Per chart review pt was seen in the ED 2 days ago for ongoing chronic chest pain. D dimer was slightly elevated at that time and CTA was pursued - negative. Pt was advised to follow up with cardiology at that time.  She reports that she did not think much of her leg pain at the time however she has gotten more more concerned about it.  She does not appointment with her PCP in 2 days time.  She mentions that her pain for started while driving for prolonged time in her car.  She denies any trauma to the leg.  She has not been taking anything specifically for the pain.  She does report the pain is worsened with ambulation.  ? ?The history is provided by the patient and medical records.  ? ?  ? ?Home Medications ?Prior to Admission medications   ?Medication Sig Start Date End Date Taking? Authorizing Provider  ?albuterol (VENTOLIN HFA) 108 (90 Base) MCG/ACT inhaler Inhale 1-2 puffs into the lungs every 6 (six) hours as needed for wheezing or shortness of breath. ?Patient taking differently: Inhale 1-2 puffs into the lungs as needed for wheezing or shortness of breath. 02/23/19   Cathie Hoops, Amy V, PA-C  ?clindamycin (CLEOCIN) 300 MG capsule Take 300 mg by mouth 2 (two) times daily. 04/16/21   [provider]  ?LOMAIRA 8 MG TABS Take 8 mg by mouth daily. 12/22/19   [provider]  ?topiramate (TOPAMAX) 50 MG tablet Take 50 mg by mouth daily. 12/19/19   [provider]  ?   ? ?Allergies    ?Patient has no known allergies.   ? ?Review of Systems   ?Review of Systems   ?Constitutional:  Negative for chills and fever.  ?Cardiovascular:  Negative for leg swelling.  ?Musculoskeletal:  Positive for arthralgias.  ?Skin:  Negative for color change and wound.  ?All other systems reviewed and are negative. ? ?Physical Exam ?Updated Vital Signs ?BP 127/90   Pulse 80   Temp 98 ?F (36.7 ?C)   Resp 18   Ht 5\' 7"  (1.702 m)   Wt 117.9 kg   SpO2 97%   BMI 40.71 kg/m?  ?Physical Exam ?Vitals and nursing note reviewed.  ?Constitutional:   ?   Appearance: She is not ill-appearing.  ?HENT:  ?   Head: Normocephalic and atraumatic.  ?Eyes:  ?   Conjunctiva/sclera: Conjunctivae normal.  ?Cardiovascular:  ?   Rate and Rhythm: Normal rate and regular rhythm.  ?Pulmonary:  ?   Effort: Pulmonary effort is normal.  ?   Breath sounds: Normal breath sounds.  ?Musculoskeletal:  ?   Comments: No obvious swelling noted to RLE compared to LLE. + TTP to lateral calf. Negative Homan's sign. 2+ DP pulse. No overlying skin changes including erythema, increased warmth, rash, ecchymosis. ROM intact to hip, knee, and ankle of RLE. Sensation intact throughout.   ?Skin: ?   General: Skin is warm and dry.  ?   Coloration: Skin is not jaundiced.  ?  Neurological:  ?   Mental Status: She is alert.  ? ? ?ED Results / Procedures / Treatments   ?Labs ?(all labs ordered are listed, but only abnormal results are displayed) ?Labs Reviewed - No data to display ? ?EKG ?None ? ?Radiology ?CT Angio Chest PE W and/or Wo Contrast ? ?Result Date: 07/12/2021 ?CLINICAL DATA:  Suspect PE.  Chest tightness. EXAM: CT ANGIOGRAPHY CHEST WITH CONTRAST TECHNIQUE: Multidetector CT imaging of the chest was performed using the standard protocol during bolus administration of intravenous contrast. Multiplanar CT image reconstructions and MIPs were obtained to evaluate the vascular anatomy. RADIATION DOSE REDUCTION: This exam was performed according to the departmental dose-optimization program which includes automated exposure control,  adjustment of the mA and/or kV according to patient size and/or use of iterative reconstruction technique. CONTRAST:  71mL OMNIPAQUE IOHEXOL 350 MG/ML SOLN COMPARISON:  CT angiogram chest 04/27/2020. FINDINGS: Cardiovascular: Satisfactory opacification of the pulmonary arteries to the segmental level. No evidence of pulmonary embolism. Normal heart size. No pericardial effusion. Mediastinum/Nodes: No enlarged mediastinal, hilar, or axillary lymph nodes. Thyroid gland, trachea, and esophagus demonstrate no significant findings. Lungs/Pleura: Lungs are clear. No pleural effusion or pneumothorax. Upper Abdomen: No acute abnormality. Musculoskeletal: No chest wall abnormality. No acute or significant osseous findings. Review of the MIP images confirms the above findings. IMPRESSION: 1. No evidence for pulmonary embolism. 2. No acute cardiopulmonary process. Electronically Signed   By: Darliss Cheney M.D.   On: 07/12/2021 21:55  ? ?US Venous Img Lower Right (DVT Study) ? ?Result Date: 07/14/2021 ?CLINICAL DATA:  Right lower extremity pain.  Evaluate for DVT. EXAM: RIGHT LOWER EXTREMITY VENOUS DOPPLER ULTRASOUND TECHNIQUE: Gray-scale sonography with graded compression, as well as color Doppler and duplex ultrasound were performed to evaluate the lower extremity deep venous systems from the level of the common femoral vein and including the common femoral, femoral, profunda femoral, popliteal and calf veins including the posterior tibial, peroneal and gastrocnemius veins when visible. The superficial great saphenous vein was also interrogated. Spectral Doppler was utilized to evaluate flow at rest and with distal augmentation maneuvers in the common femoral, femoral and popliteal veins. COMPARISON:  None Available. FINDINGS: Contralateral Common Femoral Vein: Respiratory phasicity is normal and symmetric with the symptomatic side. No evidence of thrombus. Normal compressibility. Common Femoral Vein: No evidence of thrombus.  Normal compressibility, respiratory phasicity and response to augmentation. Saphenofemoral Junction: No evidence of thrombus. Normal compressibility and flow on color Doppler imaging. Profunda Femoral Vein: No evidence of thrombus. Normal compressibility and flow on color Doppler imaging. Femoral Vein: No evidence of thrombus. Normal compressibility, respiratory phasicity and response to augmentation. Popliteal Vein: No evidence of thrombus. Normal compressibility, respiratory phasicity and response to augmentation. Calf Veins: No evidence of thrombus. Normal compressibility and flow on color Doppler imaging. Superficial Great Saphenous Vein: No evidence of thrombus. Normal compressibility. Other Findings:  None. IMPRESSION: No evidence of DVT within the right lower extremity. Electronically Signed   By: Simonne Come M.D.   On: 07/14/2021 16:59   ? ?Procedures ?Procedures  ? ? ?Medications Ordered in ED ?Medications - No data to display ? ?ED Course/ Medical Decision Making/ A&P ?  ?                        ?Medical Decision Making ?31 year old female presents to the ED today with ongoing right lower extremity calf pain for the past several weeks, worsening over the past 2 weeks.  Recently worked  up for PE 2 days ago however did not mention her leg pain at that time.  On arrival to the ED vitals stable.  On exam she is noted to have some tenderness palpation to the right lateral calf.  There is no obvious swelling or overlying skin changes including erythema, increased warmth, rash, ecchymosis.  She denies any trauma.  I do not feel x-rays would be beneficial at this time.  Will proceed with DVT study for further eval.  She does have an appointment with her PCP in 2 days time.  She was also advised to follow-up with cardiology after ED visit 2 days ago for negative PE work-up/chronic chest pain.  ? ?DVT study negative at this time. Again do not feel xrays would be beneficial at this time. Will discharge home. Pt  instructed on Ibuprofen/Tylenol PRN for pain and PCP follow up in 2 days time. She is in agreement with plan and stable for discharge home.  ? ?Problems Addressed: ?Right leg pain: acute illness or injury ? ? ?This note was

## 2021-07-14 NOTE — ED Triage Notes (Signed)
Right calf pain x 3 weeks, worsening over last 2 days  ?PCP appointment Thursday  ?

## 2021-07-14 NOTE — ED Notes (Signed)
Pt NAD, a/ox4. Pt verbalizes understanding of all DC and f/u instructions. All questions answered. Pt walks with steady gait to lobby at DC.  ? ?

## 2021-12-09 ENCOUNTER — Emergency Department (HOSPITAL_BASED_OUTPATIENT_CLINIC_OR_DEPARTMENT_OTHER)
Admission: EM | Admit: 2021-12-09 | Discharge: 2021-12-09 | Disposition: A | Discharge status: Home / Self Care | Payer: BC Managed Care – PPO | Attending: Student | Admitting: Student

## 2021-12-09 ENCOUNTER — Encounter (HOSPITAL_BASED_OUTPATIENT_CLINIC_OR_DEPARTMENT_OTHER): Payer: Self-pay

## 2021-12-09 ENCOUNTER — Other Ambulatory Visit: Payer: Self-pay

## 2021-12-09 DIAGNOSIS — R490 Dysphonia: Secondary | ICD-10-CM | POA: Diagnosis not present

## 2021-12-09 DIAGNOSIS — Z20822 Contact with and (suspected) exposure to covid-19: Secondary | ICD-10-CM | POA: Insufficient documentation

## 2021-12-09 DIAGNOSIS — R5383 Other fatigue: Secondary | ICD-10-CM | POA: Diagnosis not present

## 2021-12-09 DIAGNOSIS — J3489 Other specified disorders of nose and nasal sinuses: Secondary | ICD-10-CM | POA: Insufficient documentation

## 2021-12-09 DIAGNOSIS — R059 Cough, unspecified: Secondary | ICD-10-CM | POA: Diagnosis not present

## 2021-12-09 DIAGNOSIS — R0981 Nasal congestion: Secondary | ICD-10-CM | POA: Diagnosis not present

## 2021-12-09 DIAGNOSIS — J029 Acute pharyngitis, unspecified: Secondary | ICD-10-CM | POA: Insufficient documentation

## 2021-12-09 LAB — SARS CORONAVIRUS 2 BY RT PCR: SARS Coronavirus 2 by RT PCR: NEGATIVE

## 2021-12-09 LAB — GROUP A STREP BY PCR: Group A Strep by PCR: NOT DETECTED

## 2021-12-09 MED ORDER — LIDOCAINE VISCOUS HCL 2 % MT SOLN
15.0000 mL | Freq: Once | OROMUCOSAL | Status: AC
  Administered 2021-12-09: 15 mL via OROMUCOSAL
  Filled 2021-12-09: qty 15

## 2021-12-09 MED ORDER — IBUPROFEN 800 MG PO TABS
800.0000 mg | ORAL_TABLET | Freq: Once | ORAL | Status: AC
  Administered 2021-12-09: 800 mg via ORAL
  Filled 2021-12-09: qty 1

## 2021-12-09 MED ORDER — GUAIFENESIN ER 600 MG PO TB12: 600.0000 mg | ORAL_TABLET | Freq: Two times a day (BID) | ORAL | 0 refills | Status: DC

## 2021-12-09 MED ORDER — ACETAMINOPHEN 500 MG PO TABS
1000.0000 mg | ORAL_TABLET | Freq: Once | ORAL | Status: AC
  Administered 2021-12-09: 1000 mg via ORAL
  Filled 2021-12-09: qty 2

## 2021-12-09 NOTE — Discharge Instructions (Addendum)
You were seen in the emergency department today for a viral upper respiratory infection.  I prescribed you Mucinex to use twice a day as needed for congestion.  Please also continue using tea with honey for your voice and have vocal rest to help your vocal cords heal.  This is likely a viral infection.  Please return if you have shortness of breath or difficulty breathing or productive cough with fever.

## 2021-12-09 NOTE — ED Triage Notes (Addendum)
Pt reports mild sore throat and hoarseness. Sore in back and chest

## 2021-12-09 NOTE — ED Provider Notes (Signed)
[facility] EMERGENCY DEPARTMENT Provider Note   CSN: [id number] Arrival date & time: 12/09/21  1011     History  Chief Complaint  Patient presents with   Sore Throat   Hoarse    Carla Walker is a 31 y.o. female. With no significant past medical who presents to the emergency department with sore throat.  States she has had generalized fatigue and hoarseness for 2 days. States 2 days ago she lost her voice. States she generally has felt tired and when she "talks for a long period of time her voice gets tired." She has had associated anorexia, congestion and rhinorrhea, mild sore throat, nonproductive cough and describes having soreness in her back and chest. She denies productive cough, fever, shortness of breath or difficulty breathing, nausea or diarrhea.  Has tried theraflu, robitussin, and nyquil without relief of symptoms.    Sore Throat Pertinent negatives include no chest pain and no shortness of breath.       Home Medications Prior to Admission medications   Medication Sig Start Date End Date Taking? Authorizing Provider  guaiFENesin (MUCINEX) 600 MG 12 hr tablet Take 1 tablet (600 mg total) by mouth 2 (two) times daily. 12/09/21  Yes Mickie Hillier, PA-C  albuterol (VENTOLIN HFA) 108 (90 Base) MCG/ACT inhaler Inhale 1-2 puffs into the lungs every 6 (six) hours as needed for wheezing or shortness of breath. Patient taking differently: Inhale 1-2 puffs into the lungs as needed for wheezing or shortness of breath. 02/23/19   Tasia Catchings, Amy V, PA-C  clindamycin (CLEOCIN) 300 MG capsule Take 300 mg by mouth 2 (two) times daily. 04/16/21   [provider]  LOMAIRA 8 MG TABS Take 8 mg by mouth daily. 12/22/19   [provider]  topiramate (TOPAMAX) 50 MG tablet Take 50 mg by mouth daily. 12/19/19   [provider]      Allergies    Patient has no known allergies.    Review of Systems   Review of Systems  Constitutional:  Positive for appetite  change and fatigue. Negative for fever.  HENT:  Positive for congestion, rhinorrhea and sore throat.   Respiratory:  Positive for cough. Negative for shortness of breath.   Cardiovascular:  Negative for chest pain and palpitations.  Gastrointestinal:  Negative for diarrhea, nausea and vomiting.    Physical Exam Updated Vital Signs BP (!) 142/75   Pulse 75   Temp 98.2 F (36.8 C) (Oral)   Resp 17   Ht 5\' 8"  (1.727 m)   Wt 122.5 kg   LMP  (LMP Unknown)   SpO2 98%   BMI 41.05 kg/m  Physical Exam Vitals and nursing note reviewed.  Constitutional:      General: She is not in acute distress.    Appearance: Normal appearance. She is well-developed. She is ill-appearing. She is not toxic-appearing.  HENT:     Head: Normocephalic and atraumatic.     Nose: Congestion present.     Mouth/Throat:     Mouth: Mucous membranes are moist.     Pharynx: Oropharynx is clear. Uvula midline. Posterior oropharyngeal erythema present.     Tonsils: No tonsillar exudate or tonsillar abscesses.  Eyes:     General: No scleral icterus.    Extraocular Movements: Extraocular movements intact.     Conjunctiva/sclera: Conjunctivae normal.  Cardiovascular:     Rate and Rhythm: Normal rate and regular rhythm.     Heart sounds: Normal heart sounds. No murmur heard. Pulmonary:  Effort: Pulmonary effort is normal. No respiratory distress.     Breath sounds: Normal breath sounds. No wheezing, rhonchi or rales.  Abdominal:     General: Bowel sounds are normal.     Palpations: Abdomen is soft.  Musculoskeletal:        General: Normal range of motion.     Cervical back: Normal range of motion.  Lymphadenopathy:     Cervical: Cervical adenopathy present.  Skin:    General: Skin is warm and dry.     Capillary Refill: Capillary refill takes less than 2 seconds.  Neurological:     General: No focal deficit present.     Mental Status: She is alert and oriented to person, place, and time. Mental status is  at baseline.  Psychiatric:        Mood and Affect: Mood normal.        Behavior: Behavior normal.        Thought Content: Thought content normal.        Judgment: Judgment normal.    ED Results / Procedures / Treatments   Labs (all labs ordered are listed, but only abnormal results are displayed) Labs Reviewed  SARS CORONAVIRUS 2 BY RT PCR  GROUP A STREP BY PCR   EKG None  Radiology No results found.  Procedures Procedures   Medications Ordered in ED Medications  acetaminophen (TYLENOL) tablet 1,000 mg (1,000 mg Oral Given 12/09/21 1127)  ibuprofen (ADVIL) tablet 800 mg (800 mg Oral Given 12/09/21 1127)  lidocaine (XYLOCAINE) 2 % viscous mouth solution 15 mL (15 mLs Mouth/Throat Given 12/09/21 1128)    ED Course/ Medical Decision Making/ A&P                           Medical Decision Making Risk OTC drugs. Prescription drug management.   This patient presents to the ED with chief complaint(s) of sore thoat with pertinent past medical history of not pertinent which further complicates the presenting complaint. The complaint involves an extensive differential diagnosis and also carries with it a high risk of complications and morbidity.    The differential diagnosis includes viral pharyngitis, strep throat, RPA, PTA, Ludwig's angina, tonsillitis, epiglottitis, bacterial tracheitis, mononucleosis, etc.   Additional history obtained: Additional history obtained from  none available Records reviewed Care Everywhere/External Records  ED Course and Reassessment: 31 year old female who presents to the emergency department with sore throat, congestion and general malaise.  Physical exam is relatively unremarkable.  Her lung sounds are clear bilaterally without hypoxia or productive cough or fever.  Her posterior oropharynx is difficult to visualize however do not see any obvious tonsillar exudates or PTA.  There is no stridor present.  She does have some cervical adenopathy  that is nontender. COVID is negative Strep is negative Given Tylenol, Motrin and viscous lidocaine  Think this is likely a viral pharyngitis.  We will give her prescription for Mucinex and return precautions for worsening respiratory symptoms.  She is agreeable to this.  Also discussed vocal rest to let her voice return.  She verbalized understanding.  Do not think that her symptoms are consistent with RPA, PTA, Ludwig's angina, tonsillitis, epiglottitis, bacterial tracheitis or mono.  Safe for discharge.  Independent labs interpretation:  The following labs were independently interpreted:  COVID negative Strep negative  Independent visualization of imaging: Not indicated  Consultation: - Consulted or discussed management/test interpretation w/ external professional: Not indicated  Consideration for admission or further  workup: Not indicated Social Determinants of health: None identified Final Clinical Impression(s) / ED Diagnoses Final diagnoses:  Viral pharyngitis    Rx / DC Orders ED Discharge Orders          Ordered    guaiFENesin (MUCINEX) 600 MG 12 hr tablet  2 times daily        12/09/21 1158              Mickie Hillier, PA-C 12/09/21 1201    [place], [place], MD 12/09/21 2108

## 2022-05-05 ENCOUNTER — Ambulatory Visit
Admission: EM | Admit: 2022-05-05 | Discharge: 2022-05-05 | Disposition: A | Discharge status: Home / Self Care | Payer: BC Managed Care – PPO | Attending: Family Medicine | Admitting: Family Medicine

## 2022-05-05 DIAGNOSIS — J029 Acute pharyngitis, unspecified: Secondary | ICD-10-CM | POA: Insufficient documentation

## 2022-05-05 LAB — POCT RAPID STREP A (OFFICE): Rapid Strep A Screen: NEGATIVE

## 2022-05-05 MED ORDER — LIDOCAINE VISCOUS HCL 2 % MT SOLN: 5.0000 mL | Freq: Four times a day (QID) | OROMUCOSAL | 0 refills | Status: DC | PRN

## 2022-05-05 NOTE — ED Provider Notes (Signed)
  [name]   [id number] 05/05/22 Arrival Time: [id number]  ASSESSMENT & PLAN:  1. Sore throat    Likely viral. Tolerating PO intake. No signs of peritonsillar abscess.  Meds ordered this encounter  Medications   magic mouthwash (lidocaine, diphenhydrAMINE, alum & mag hydroxide) suspension    Sig: Swish and spit 5 mLs 4 (four) times daily as needed for mouth pain.    Dispense:  360 mL    Refill:  0    Results for orders placed or performed during the hospital encounter of 05/05/22  POCT rapid strep A  Result Value Ref Range   Rapid Strep A Screen Negative Negative   Throat culture sent/pending.    Discharge Instructions      You may use over the counter ibuprofen or acetaminophen as needed.  For a sore throat, over the counter products such as [employer] Peroxyl Mouth Sore Rinse or Chloraseptic Sore Throat Spray may provide some temporary relief. Your rapid strep test was negative today. We have sent your throat swab for culture and will let you know of any positive results.    Reviewed expectations re: course of current medical issues. Questions answered. Outlined signs and symptoms indicating need for more acute intervention. Patient verbalized understanding. After Visit Summary given.   SUBJECTIVE:  Carla Walker is a 32 y.o. female who reports a sore throat; abrupt onset; x 5-6 days; worse at night; son with similar. Denies fever. No resp symptoms. No associated nausea, vomiting, or abdominal pain.  No tx PTA.   OBJECTIVE:  Vitals:   05/05/22 1302  BP: (!) 145/87  Pulse: 80  Resp: 16  Temp: 97.7 F (36.5 C)  TempSrc: Oral  SpO2: 97%    General appearance: alert; no distress HEENT: throat with moderate erythema and cobblestoning; uvula is midline Neck: supple with FROM; no lymphadenopathy Lungs: speaks full sentences without difficulty; unlabored Abd: soft; non-tender Skin: reveals no rash; warm and dry Psychological: alert and cooperative;  normal mood and affect  No Known Allergies  Past Medical History:  Diagnosis Date   Anemia    Social History   Socioeconomic History   Marital status: Single    Spouse name: Not on file   Number of children: Not on file   Years of education: Not on file   Highest education level: Not on file  Occupational History   Not on file  Tobacco Use   Smoking status: Never   Smokeless tobacco: Never  Vaping Use   Vaping Use: Never used  Substance and Sexual Activity   Alcohol use: Yes    Comment: occasionally    Drug use: No   Sexual activity: Not on file  Other Topics Concern   Not on file  Social History Narrative   Not on file   Social Determinants of Health   Financial Resource Strain: Not on file  Food Insecurity: Not on file  Transportation Needs: Not on file  Physical Activity: Not on file  Stress: Not on file  Social Connections: Not on file  Intimate Partner Violence: Not on file   Family History  Problem Relation Age of Onset   Diabetes Father    Hypertension Father    Diabetes Paternal Uncle    Hypertension Paternal Uncle    Diabetes Paternal Grandfather    Hypertension Paternal Reymundo Poll, MD 05/05/22 [phone]

## 2022-05-05 NOTE — ED Triage Notes (Signed)
Pt c/o sore throat x 1 week denies cough, nasal congestion. States sometimes has a week.

## 2022-05-05 NOTE — Discharge Instructions (Addendum)
You may use over the counter ibuprofen or acetaminophen as needed.  For a sore throat, over the counter products such as [employer] Peroxyl Mouth Sore Rinse or Chloraseptic Sore Throat Spray may provide some temporary relief. Your rapid strep test was negative today. We have sent your throat swab for culture and will let you know of any positive results. 

## 2022-05-08 LAB — CULTURE, GROUP A STREP (THRC)

## 2022-06-01 IMAGING — CT CT ANGIO CHEST
2 of 8 series · 19 of 36 positions shown · IV contrast (agent unspecified)
Comparison: CT angiogram chest 04/27/2020.

CLINICAL DATA: Suspect PE.  Chest tightness.

EXAM:
CT ANGIOGRAPHY CHEST WITH CONTRAST
TECHNIQUE: Multidetector CT imaging of the chest was performed using the
standard protocol during bolus administration of intravenous
contrast. Multiplanar CT image reconstructions and MIPs were
obtained to evaluate the vascular anatomy.

[Series 5: pe thins · axial · 0.74mm/px · z∈[-300,-62]mm · 18 of 266 slices shown]
[im 14/266  lung]
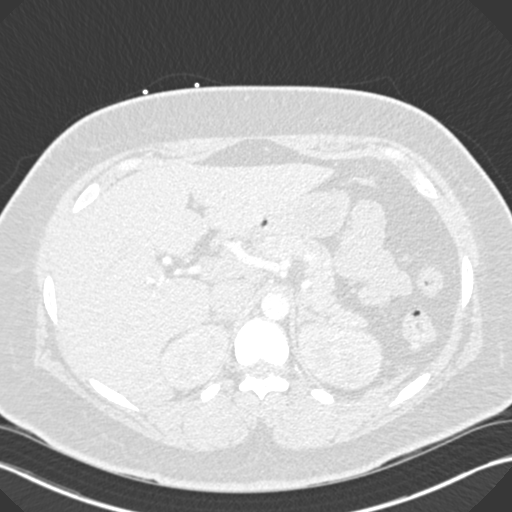
[im 28/266  mediastinal]
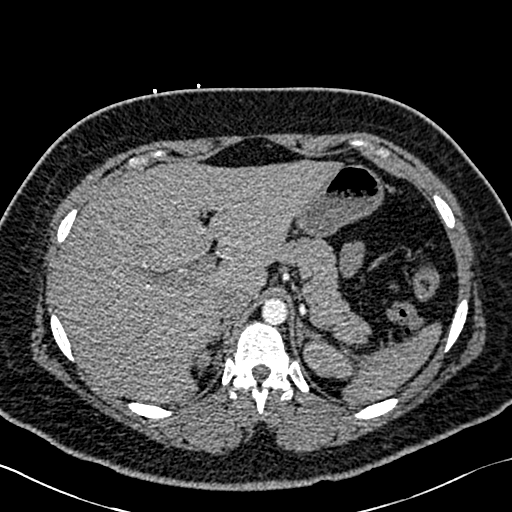
[im 42/266  lung]
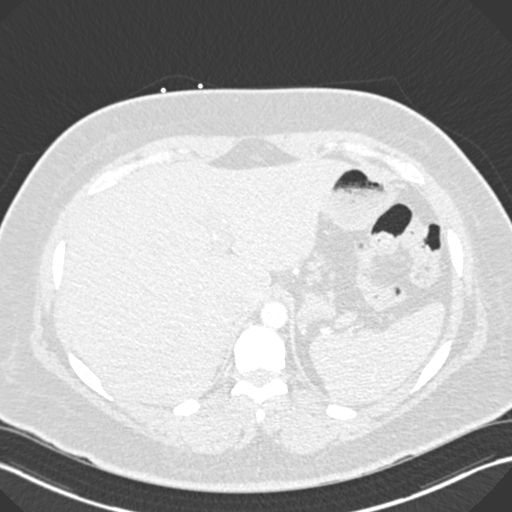
[im 56/266  mediastinal]
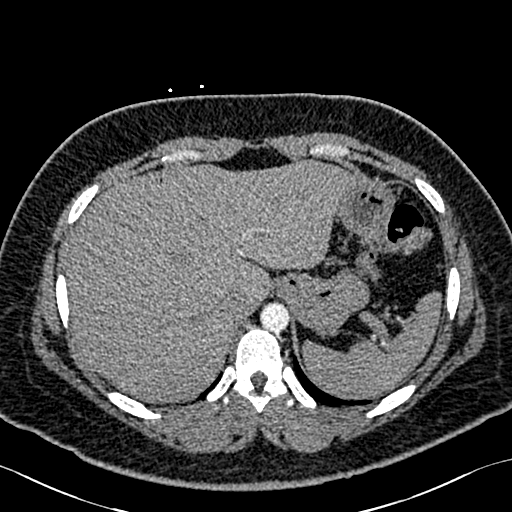
[im 70/266  lung]
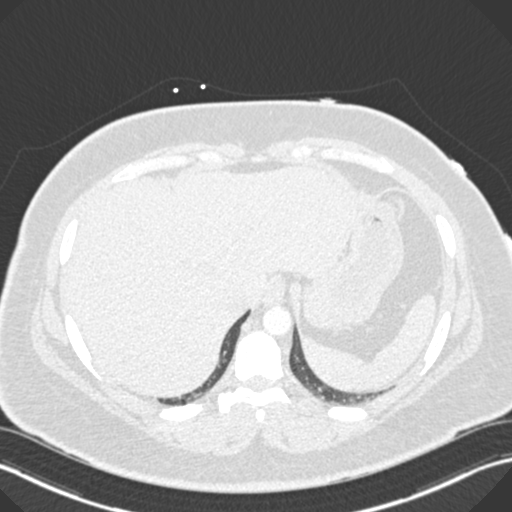
[im 84/266  mediastinal]
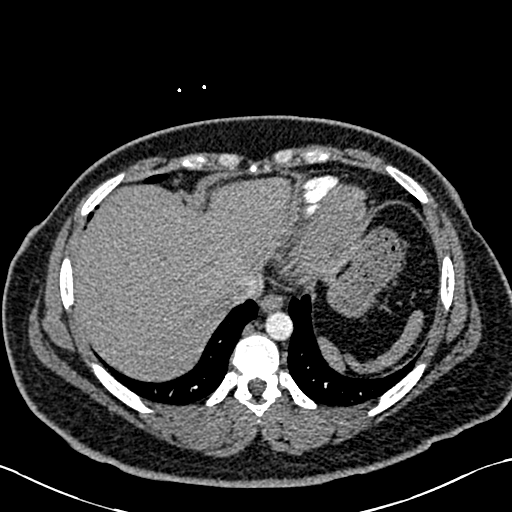
[im 98/266  lung]
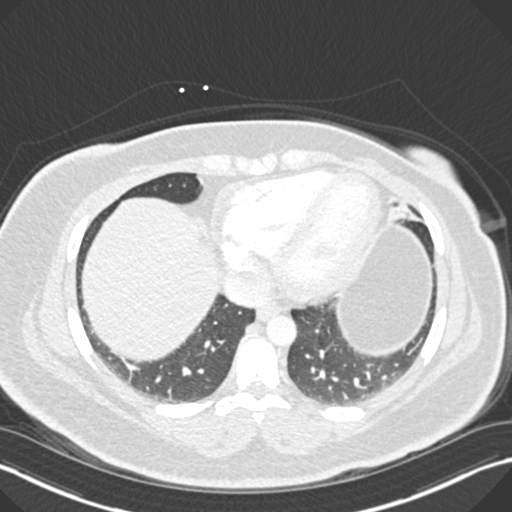
[im 112/266  mediastinal]
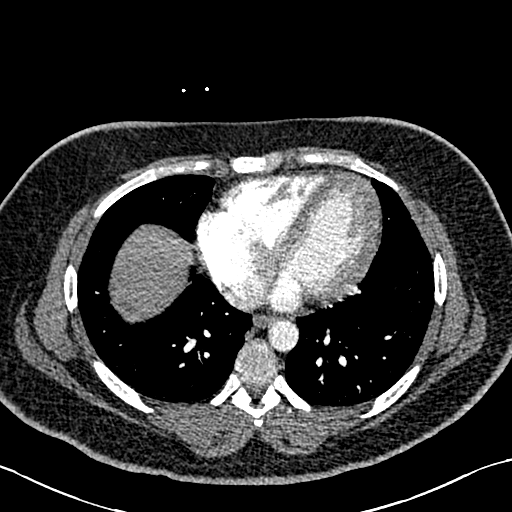
[im 126/266  lung]
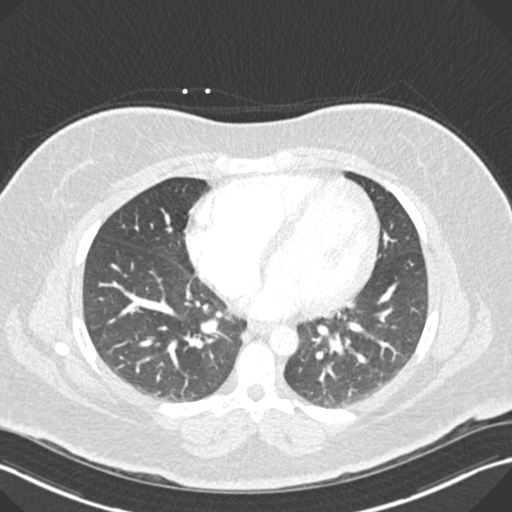
[im 140/266  mediastinal]
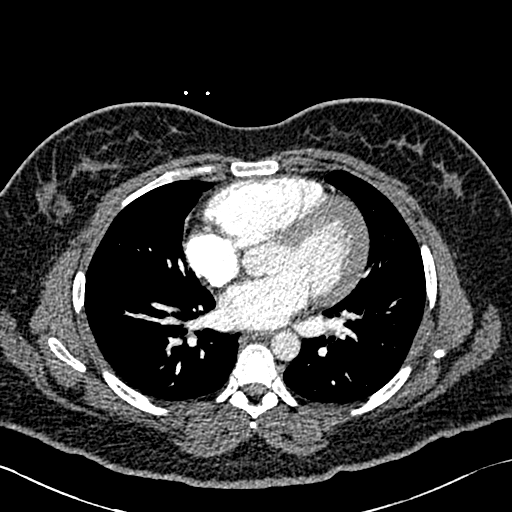
[im 154/266  lung]
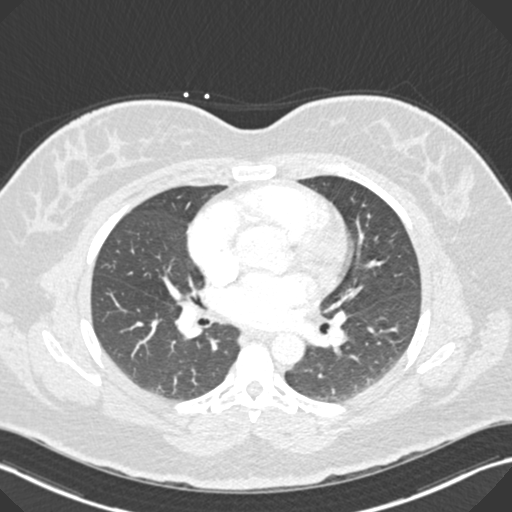
[im 168/266  mediastinal]
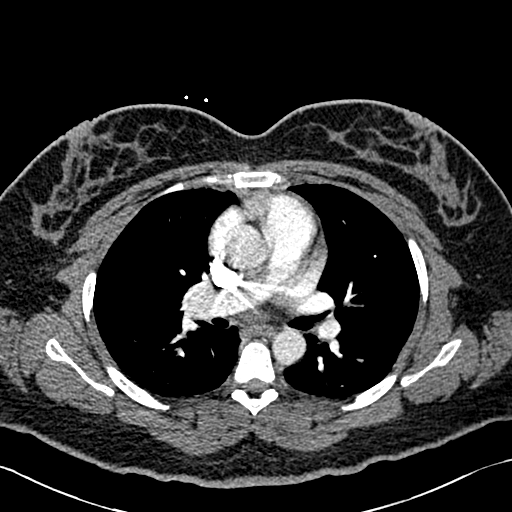
[im 182/266  lung]
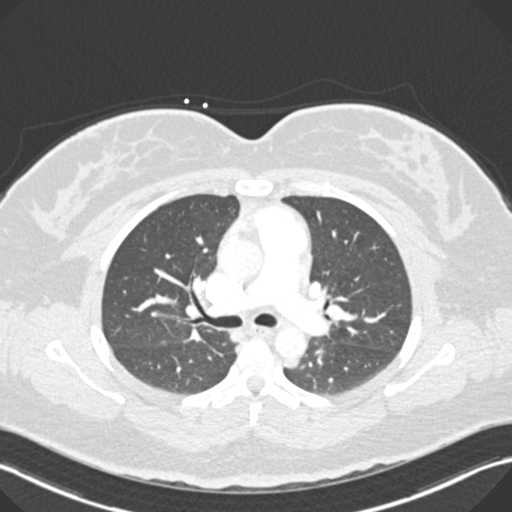
[im 196/266  mediastinal]
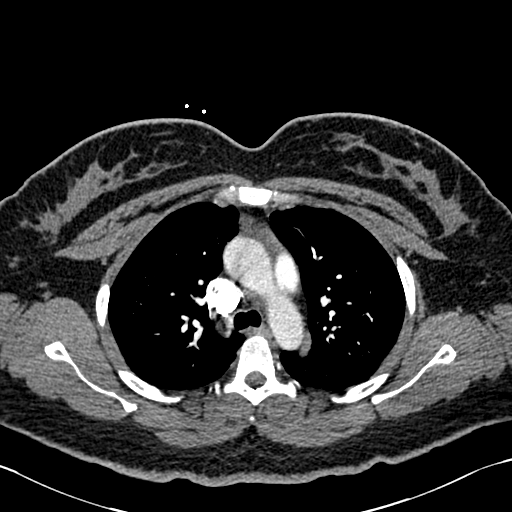
[im 210/266  lung]
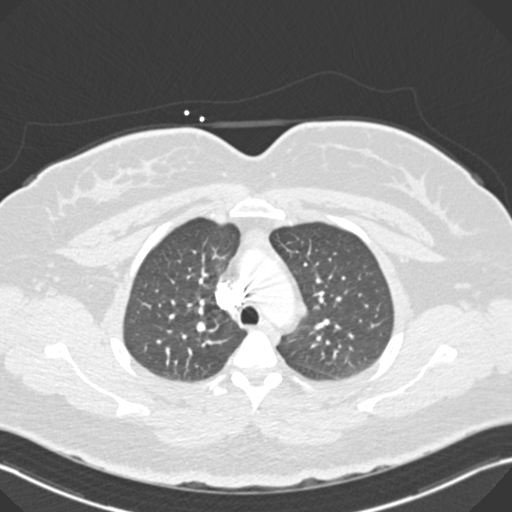
[im 224/266  mediastinal]
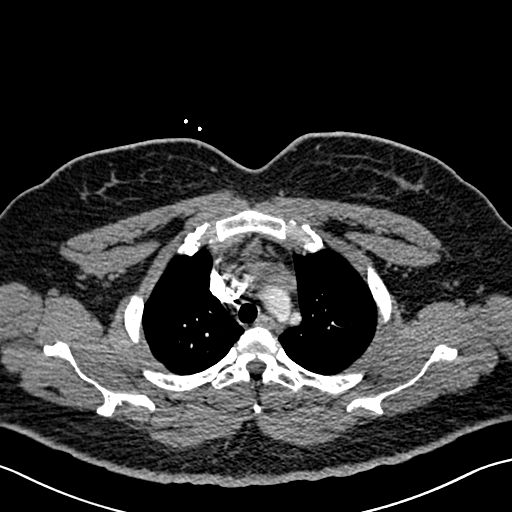
[im 238/266  lung]
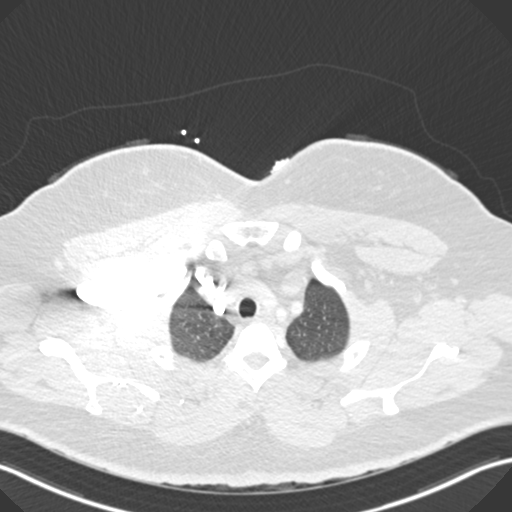
[im 252/266  mediastinal]
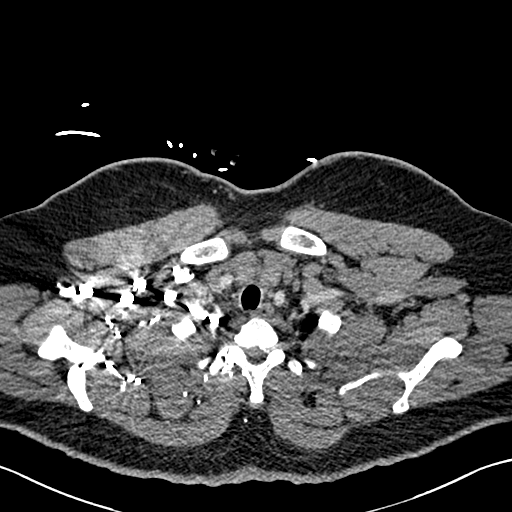

[Series 7: pe coronal mpr · coronal · 0.56mm/px · 1 of 157 slices shown]
[im 79/157  mediastinal]
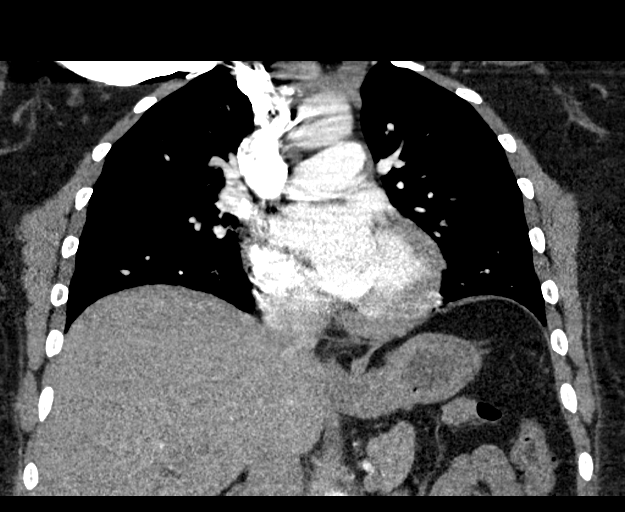

[19 of 36 positions shown; findings below may reference images not displayed]

RADIATION DOSE REDUCTION: This exam was performed according to the
departmental dose-optimization program which includes automated
exposure control, adjustment of the mA and/or kV according to
patient size and/or use of iterative reconstruction technique.

CONTRAST:  75mL OMNIPAQUE IOHEXOL 350 MG/ML SOLN
FINDINGS: Cardiovascular: Satisfactory opacification of the pulmonary arteries
to the segmental level. No evidence of pulmonary embolism. Normal
heart size. No pericardial effusion.

Mediastinum/Nodes: No enlarged mediastinal, hilar, or axillary lymph
nodes. Thyroid gland, trachea, and esophagus demonstrate no
significant findings.

Lungs/Pleura: Lungs are clear. No pleural effusion or pneumothorax.

Upper Abdomen: No acute abnormality.

Musculoskeletal: No chest wall abnormality. No acute or significant
osseous findings.

Review of the MIP images confirms the above findings.
IMPRESSION: 1. No evidence for pulmonary embolism.
2. No acute cardiopulmonary process.

## 2022-06-03 IMAGING — US US EXTREM LOW VENOUS*R*
1 series · 13 of 24 positions shown · non-contrast
Comparison: None Available.

CLINICAL DATA: Right lower extremity pain.  Evaluate for DVT.



[Series 1: us extrem low venous*right* · 13 of 29 slices shown]
[im 1/29]
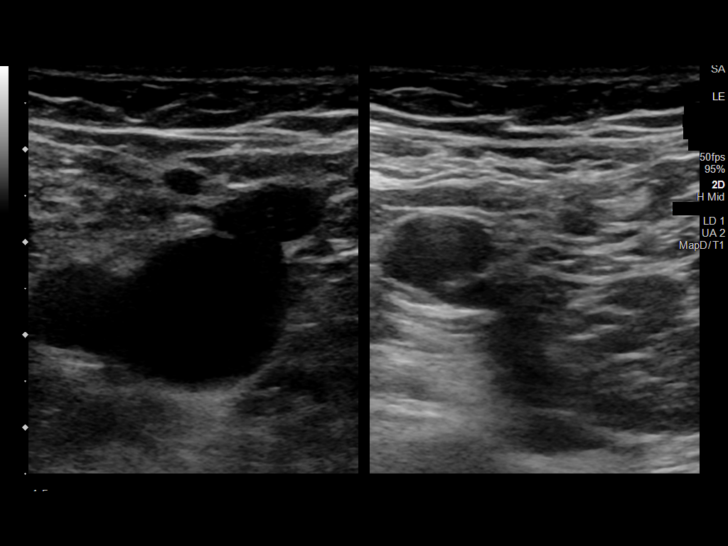
[im 3/29]
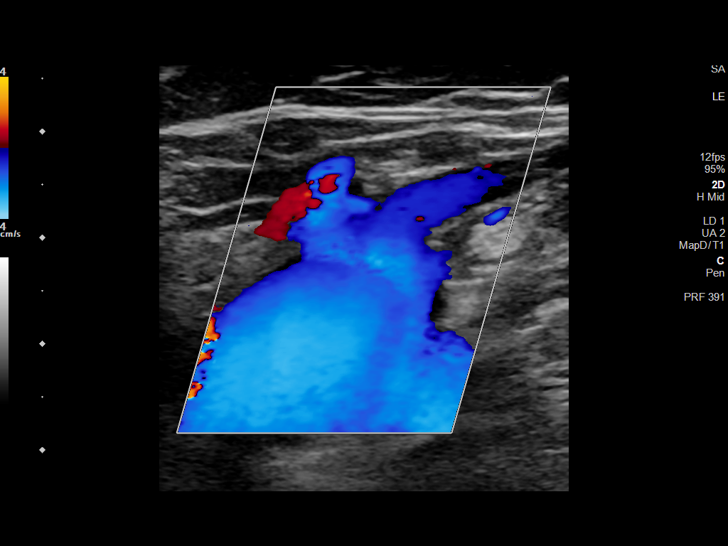
[im 5/29]
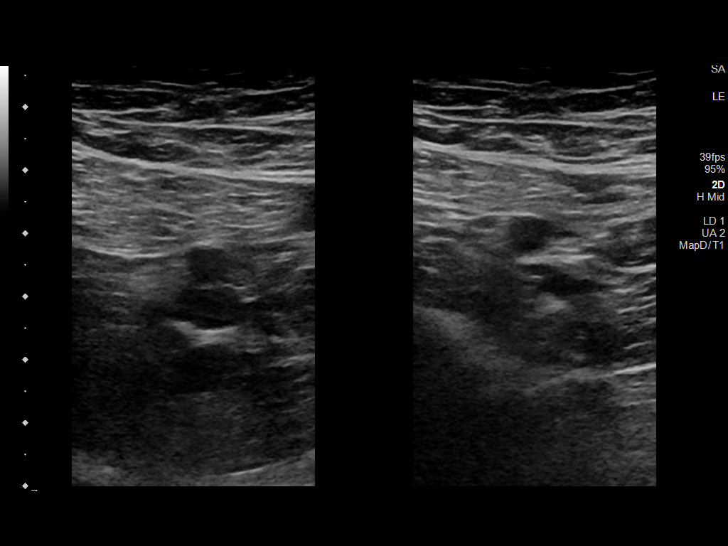
[im 8/29]
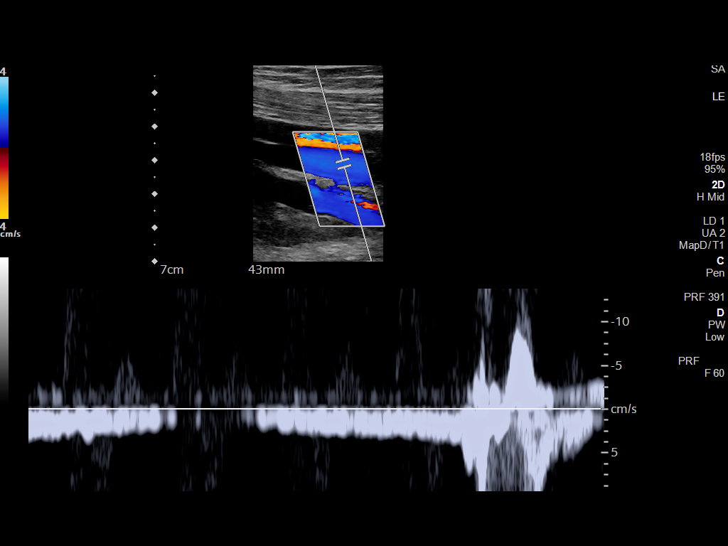
[im 10/29]
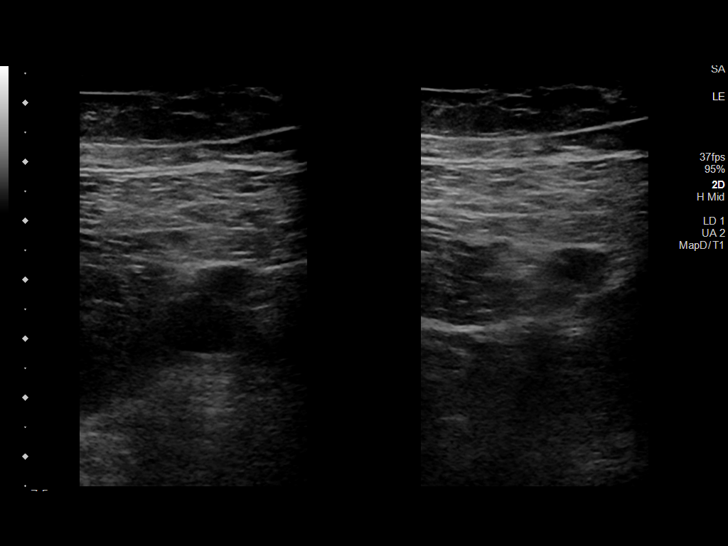
[im 13/29]
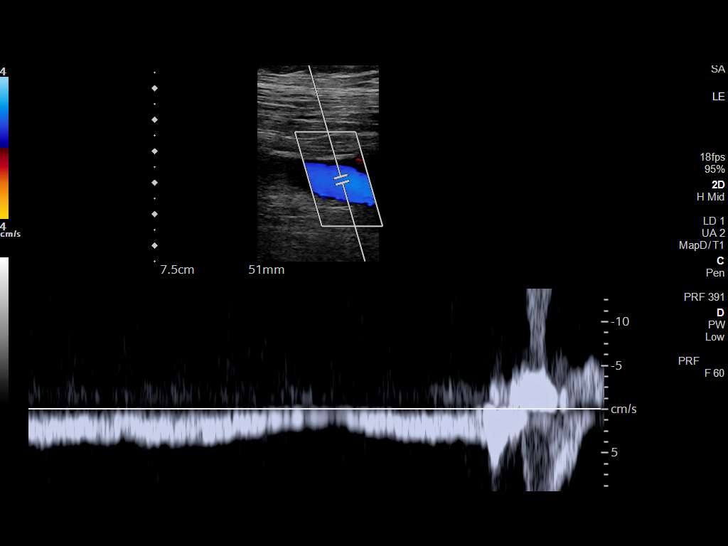
[im 15/29]
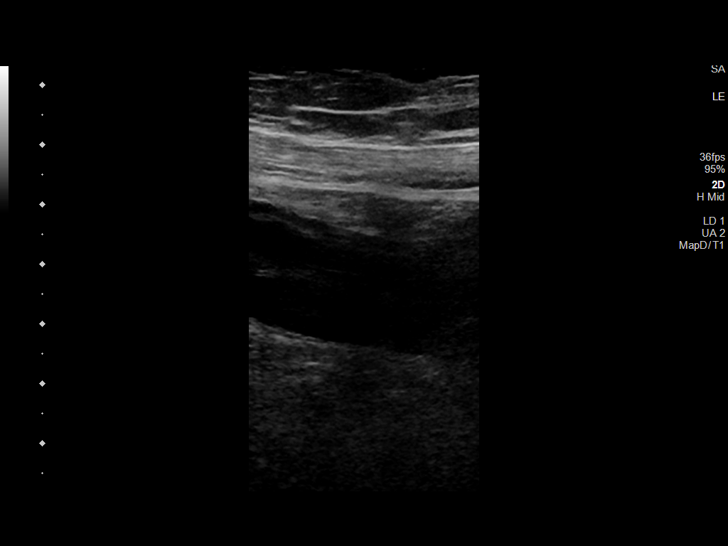
[im 16/29]
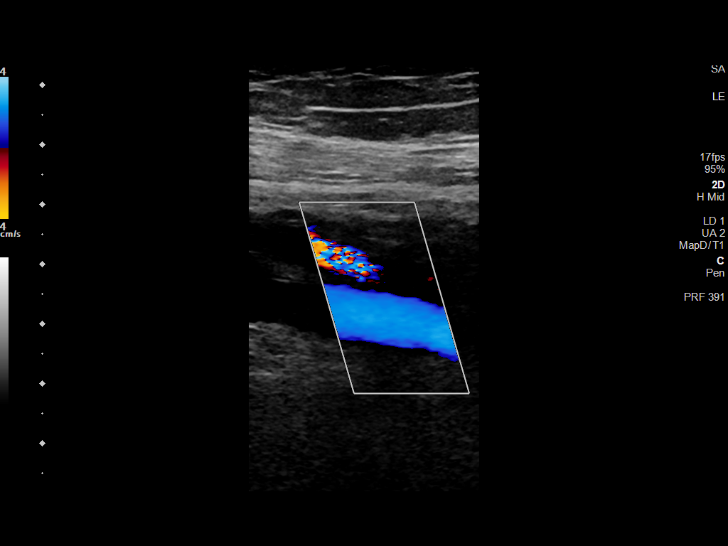
[im 19/29]
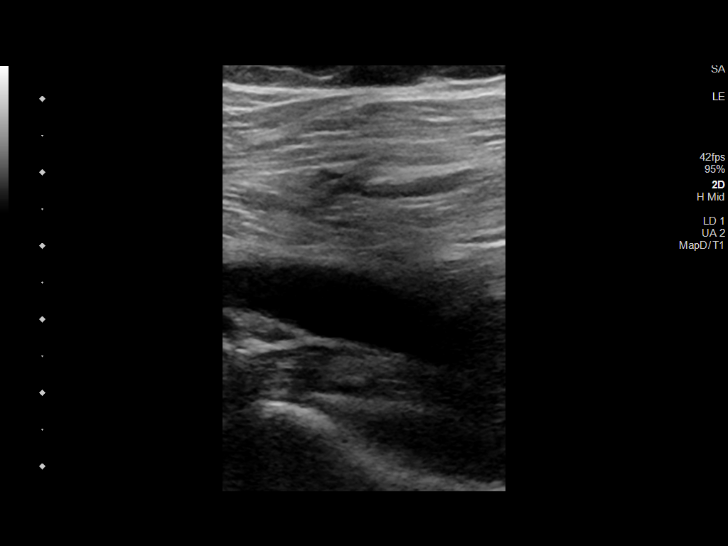
[im 21/29]
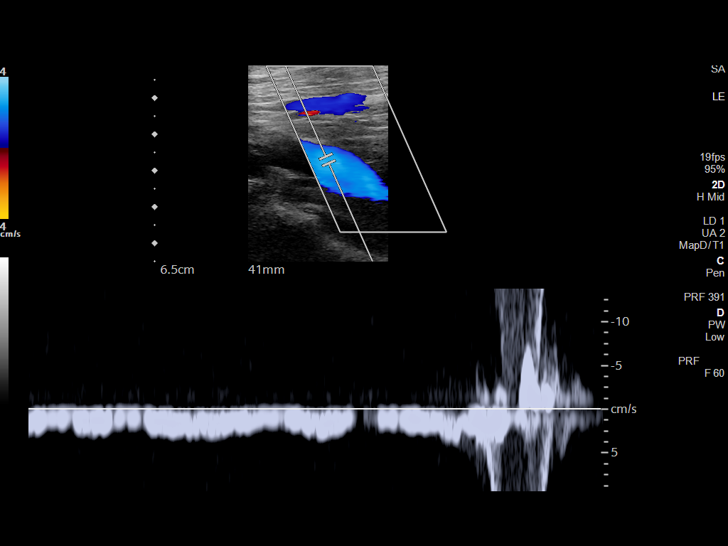
[im 24/29]
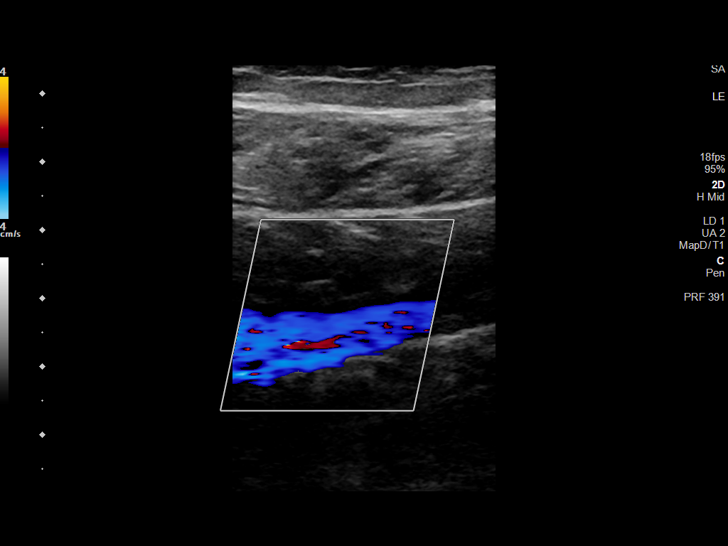
[im 26/29]
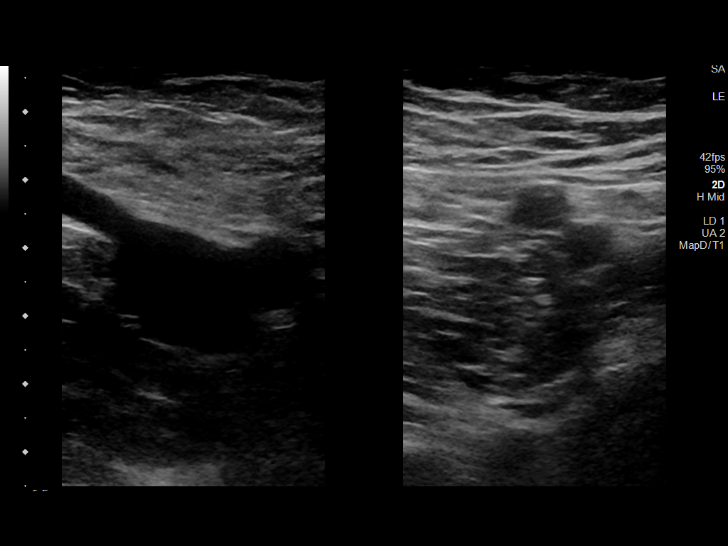
[im 29/29]
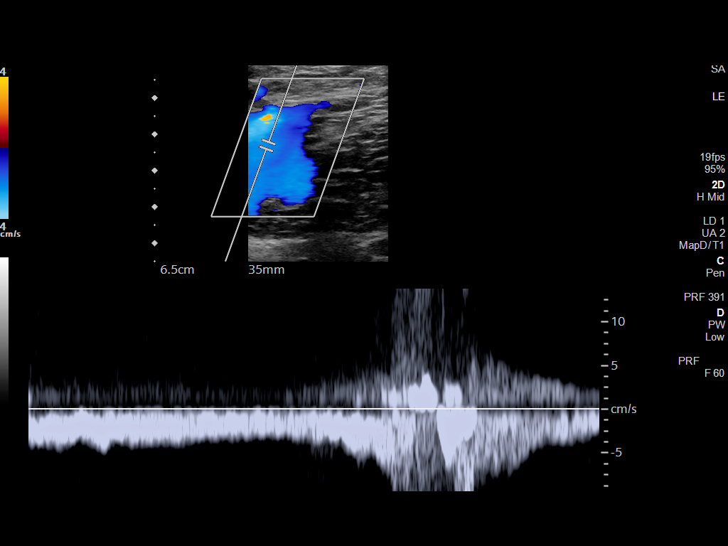

[13 of 24 positions shown; findings below may reference images not displayed]

FINDINGS: Contralateral Common Femoral Vein: Respiratory phasicity is normal
and symmetric with the symptomatic side. No evidence of thrombus.
Normal compressibility.

Common Femoral Vein: No evidence of thrombus. Normal
compressibility, respiratory phasicity and response to augmentation.

Saphenofemoral Junction: No evidence of thrombus. Normal
compressibility and flow on color Doppler imaging.

Profunda Femoral Vein: No evidence of thrombus. Normal
compressibility and flow on color Doppler imaging.

Femoral Vein: No evidence of thrombus. Normal compressibility,
respiratory phasicity and response to augmentation.

Popliteal Vein: No evidence of thrombus. Normal compressibility,
respiratory phasicity and response to augmentation.

Calf Veins: No evidence of thrombus. Normal compressibility and flow
on color Doppler imaging.

Superficial Great Saphenous Vein: No evidence of thrombus. Normal
compressibility.

Other Findings:  None.
IMPRESSION: No evidence of DVT within the right lower extremity.

## 2023-05-30 ENCOUNTER — Ambulatory Visit: Payer: Medicaid Other | Admitting: Family Medicine

## 2023-05-31 ENCOUNTER — Ambulatory Visit: Payer: Medicaid Other | Admitting: Family Medicine

## 2023-05-31 ENCOUNTER — Encounter: Payer: Self-pay | Admitting: Family Medicine

## 2023-05-31 VITALS — BP 137/93 | HR 69 | Temp 98.1°F | Resp 16 | Ht 69.0 in | Wt 263.4 lb

## 2023-05-31 DIAGNOSIS — Z1329 Encounter for screening for other suspected endocrine disorder: Secondary | ICD-10-CM

## 2023-05-31 DIAGNOSIS — Z13228 Encounter for screening for other metabolic disorders: Secondary | ICD-10-CM

## 2023-05-31 DIAGNOSIS — Z0000 Encounter for general adult medical examination without abnormal findings: Secondary | ICD-10-CM

## 2023-05-31 DIAGNOSIS — Z13220 Encounter for screening for lipoid disorders: Secondary | ICD-10-CM

## 2023-05-31 DIAGNOSIS — Z130 Encounter for screening for diseases of the blood and blood-forming organs and certain disorders involving the immune mechanism: Secondary | ICD-10-CM | POA: Diagnosis not present

## 2023-05-31 DIAGNOSIS — Z7689 Persons encountering health services in other specified circumstances: Secondary | ICD-10-CM

## 2023-05-31 NOTE — Progress Notes (Signed)
 New Patient Office Visit  Subjective    Patient ID: Carla Walker, female    DOB: 1990/04/25  Age: 33 y.o. MRN: [id number]  CC:  Chief Complaint  Patient presents with   Establish Care   Annual Exam    HPI Carla Walker presents to establish care and for routine annual exam. Patient denies known chronic med issues or taking regular meds. Denies acute complaints.   Outpatient Encounter Medications as of 05/31/2023  Medication Sig   albuterol (VENTOLIN HFA) 108 (90 Base) MCG/ACT inhaler Inhale 1-2 puffs into the lungs every 6 (six) hours as needed for wheezing or shortness of breath. (Patient not taking: Reported on 05/31/2023)   magic mouthwash (lidocaine, diphenhydrAMINE, alum & mag hydroxide) suspension Swish and spit 5 mLs 4 (four) times daily as needed for mouth pain. (Patient not taking: Reported on 05/31/2023)   No facility-administered encounter medications on file as of 05/31/2023.    Past Medical History:  Diagnosis Date   Anemia     Past Surgical History:  Procedure Laterality Date   CESAREAN SECTION     CESAREAN SECTION WITH BILATERAL TUBAL LIGATION N/A 03/09/2018   Procedure: CESAREAN SECTION WITH BILATERAL TUBAL LIGATION;  Surgeon: Philip Aspen, DO;  Location: [facility];  Service: Obstetrics;  Laterality: N/A;   HERNIA REPAIR     TONSILLECTOMY      Family History  Problem Relation Age of Onset   Diabetes Father    Hypertension Father    Diabetes Paternal Uncle    Hypertension Paternal Uncle    Diabetes Paternal Grandfather    Hypertension Paternal Grandfather     Social History   Socioeconomic History   Marital status: Single    Spouse name: Not on file   Number of children: Not on file   Years of education: Not on file   Highest education level: Not on file  Occupational History   Not on file  Tobacco Use   Smoking status: Never   Smokeless tobacco: Never  Vaping Use   Vaping status: Never Used  Substance and Sexual  Activity   Alcohol use: Yes    Comment: occasionally    Drug use: No   Sexual activity: Not on file  Other Topics Concern   Not on file  Social History Narrative   Not on file   Social Drivers of Health   Financial Resource Strain: Low Risk  (05/31/2023)   Overall Financial Resource Strain (CARDIA)    Difficulty of Paying Living Expenses: Not hard at all  Food Insecurity: No Food Insecurity (05/31/2023)   Hunger Vital Sign    Worried About Running Out of Food in the Last Year: Never true    Ran Out of Food in the Last Year: Never true  Transportation Needs: No Transportation Needs (05/31/2023)   PRAPARE - Administrator, Civil Service (Medical): No    Lack of Transportation (Non-Medical): No  Physical Activity: Inactive (05/31/2023)   Exercise Vital Sign    Days of Exercise per Week: 0 days    Minutes of Exercise per Session: 0 min  Stress: No Stress Concern Present (05/31/2023)   [employer] of Occupational Health - Occupational Stress Questionnaire    Feeling of Stress : Only a little  Social Connections: Moderately Integrated (05/31/2023)   Social Connection and Isolation Panel [NHANES]    Frequency of Communication with Friends and Family: Twice a week    Frequency of Social Gatherings with Friends and Family: More  than three times a week    Attends Religious Services: 1 to 4 times per year    Active Member of Clubs or Organizations: No    Attends Banker Meetings: Never    Marital Status: Living with partner  Intimate Partner Violence: Not At Risk (05/31/2023)   Humiliation, Afraid, Rape, and Kick questionnaire    Fear of Current or Ex-Partner: No    Emotionally Abused: No    Physically Abused: No    Sexually Abused: No    Review of Systems  All other systems reviewed and are negative.       Objective   BP (!) 137/93   Pulse 69   Temp 98.1 F (36.7 C) (Oral)   Resp 16   Ht 5\' 9"  (1.753 m)   Wt 263 lb 6.4 oz (119.5 kg)   SpO2  95%   BMI 38.90 kg/m   Physical Exam Vitals and nursing note reviewed.  Constitutional:      General: She is not in acute distress. HENT:     Head: Normocephalic and atraumatic.     Right Ear: Tympanic membrane, ear canal and external ear normal.     Left Ear: Tympanic membrane, ear canal and external ear normal.     Nose: Nose normal.     Mouth/Throat:     Mouth: Mucous membranes are moist.     Pharynx: Oropharynx is clear.  Eyes:     Conjunctiva/sclera: Conjunctivae normal.     Pupils: Pupils are equal, round, and reactive to light.  Neck:     Thyroid: No thyromegaly.  Cardiovascular:     Rate and Rhythm: Normal rate and regular rhythm.     Heart sounds: Normal heart sounds. No murmur heard. Pulmonary:     Effort: Pulmonary effort is normal. No respiratory distress.     Breath sounds: Normal breath sounds.  Abdominal:     General: There is no distension.     Palpations: Abdomen is soft. There is no mass.     Tenderness: There is no abdominal tenderness.  Musculoskeletal:        General: Normal range of motion.     Cervical back: Normal range of motion and neck supple.  Skin:    General: Skin is warm and dry.  Neurological:     General: No focal deficit present.     Mental Status: She is alert and oriented to person, place, and time.  Psychiatric:        Mood and Affect: Mood normal.        Behavior: Behavior normal.         Assessment & Plan:   Annual physical exam -     CMP14+EGFR  Screening for deficiency anemia -     CBC with Differential/Platelet  Screening for lipid disorders -     Lipid panel  Screening for endocrine/metabolic/immunity disorders -     Hemoglobin A1c  Encounter to establish care     No follow-ups on file.   Tommie Raymond, MD

## 2023-06-01 LAB — CBC WITH DIFFERENTIAL/PLATELET
Basophils Absolute: 0 10*3/uL (ref 0.0–0.2)
Basos: 0 %
EOS (ABSOLUTE): 0.2 10*3/uL (ref 0.0–0.4)
Eos: 2 %
Hematocrit: 38 % (ref 34.0–46.6)
Hemoglobin: 11.7 g/dL (ref 11.1–15.9)
Immature Grans (Abs): 0 10*3/uL (ref 0.0–0.1)
Immature Granulocytes: 0 %
Lymphocytes Absolute: 2.8 10*3/uL (ref 0.7–3.1)
Lymphs: 35 %
MCH: 24.1 pg — ABNORMAL LOW (ref 26.6–33.0)
MCHC: 30.8 g/dL — ABNORMAL LOW (ref 31.5–35.7)
MCV: 78 fL — ABNORMAL LOW (ref 79–97)
Monocytes Absolute: 0.4 10*3/uL (ref 0.1–0.9)
Monocytes: 6 %
Neutrophils Absolute: 4.6 10*3/uL (ref 1.4–7.0)
Neutrophils: 57 %
Platelets: 435 10*3/uL (ref 150–450)
RBC: 4.86 x10E6/uL (ref 3.77–5.28)
RDW: 14.5 % (ref 11.7–15.4)
WBC: 8 10*3/uL (ref 3.4–10.8)

## 2023-06-01 LAB — CMP14+EGFR
ALT: 14 IU/L (ref 0–32)
AST: 16 IU/L (ref 0–40)
Albumin: 4.6 g/dL (ref 3.9–4.9)
Alkaline Phosphatase: 86 IU/L (ref 44–121)
BUN/Creatinine Ratio: 20 (ref 9–23)
BUN: 14 mg/dL (ref 6–20)
Bilirubin Total: <0.2 mg/dL (ref 0.0–1.2)
CO2: 19 mmol/L — ABNORMAL LOW (ref 20–29)
Calcium: 9.6 mg/dL (ref 8.7–10.2)
Chloride: 105 mmol/L (ref 96–106)
Creatinine, Ser: 0.69 mg/dL (ref 0.57–1.00)
Globulin, Total: 2.5 g/dL (ref 1.5–4.5)
Glucose: 87 mg/dL (ref 70–99)
Potassium: 4.7 mmol/L (ref 3.5–5.2)
Sodium: 138 mmol/L (ref 134–144)
Total Protein: 7.1 g/dL (ref 6.0–8.5)
eGFR: 117 mL/min/{1.73_m2} (ref >59)

## 2023-06-01 LAB — LIPID PANEL
Chol/HDL Ratio: 4.1 ratio (ref 0.0–4.4)
Cholesterol, Total: 167 mg/dL (ref 100–199)
HDL: 41 mg/dL (ref >39)
LDL Chol Calc (NIH): 106 mg/dL — ABNORMAL HIGH (ref 0–99)
Triglycerides: 109 mg/dL (ref 0–149)
VLDL Cholesterol Cal: 20 mg/dL (ref 5–40)

## 2023-06-01 LAB — HEMOGLOBIN A1C
Est. average glucose Bld gHb Est-mCnc: 117 mg/dL
Hgb A1c MFr Bld: 5.7 % — ABNORMAL HIGH (ref 4.8–5.6)

## 2023-07-07 ENCOUNTER — Ambulatory Visit: Admitting: Family Medicine

## 2023-07-14 ENCOUNTER — Ambulatory Visit (INDEPENDENT_AMBULATORY_CARE_PROVIDER_SITE_OTHER): Admitting: Family Medicine

## 2023-07-14 VITALS — BP 125/85 | HR 61 | Temp 98.1°F | Resp 16 | Ht 69.0 in | Wt 265.0 lb

## 2023-07-14 DIAGNOSIS — E6609 Other obesity due to excess calories: Secondary | ICD-10-CM | POA: Diagnosis not present

## 2023-07-14 DIAGNOSIS — Z7689 Persons encountering health services in other specified circumstances: Secondary | ICD-10-CM | POA: Diagnosis not present

## 2023-07-14 DIAGNOSIS — Z6839 Body mass index (BMI) 39.0-39.9, adult: Secondary | ICD-10-CM

## 2023-07-14 DIAGNOSIS — E66812 Obesity, class 2: Secondary | ICD-10-CM | POA: Diagnosis not present

## 2023-07-14 MED ORDER — PHENTERMINE HCL 37.5 MG PO TABS: 37.5000 mg | ORAL_TABLET | Freq: Every day | ORAL | 0 refills | Status: AC

## 2023-07-14 NOTE — Progress Notes (Unsigned)
 Patient is here for lab work review with Pcp

## 2023-07-18 ENCOUNTER — Encounter: Payer: Self-pay | Admitting: Family Medicine

## 2023-07-18 NOTE — Progress Notes (Signed)
 Established Patient Office Visit  Subjective    Patient ID: Carla Walker, female    DOB: Jan 04, 1991  Age: 33 y.o. MRN: [id number]  CC: No chief complaint on file.   HPI Carla Walker presents for routine weight management.   Outpatient Encounter Medications as of 07/14/2023  Medication Sig   phentermine  (ADIPEX-P ) 37.5 MG tablet Take 1 tablet (37.5 mg total) by mouth daily before breakfast.   [DISCONTINUED] albuterol  (VENTOLIN  HFA) 108 (90 Base) MCG/ACT inhaler Inhale 1-2 puffs into the lungs every 6 (six) hours as needed for wheezing or shortness of breath. (Patient not taking: Reported on 05/31/2023)   [DISCONTINUED] magic mouthwash (lidocaine , diphenhydrAMINE , alum & mag hydroxide) suspension Swish and spit 5 mLs 4 (four) times daily as needed for mouth pain. (Patient not taking: Reported on 07/14/2023)   No facility-administered encounter medications on file as of 07/14/2023.    Past Medical History:  Diagnosis Date   Anemia     Past Surgical History:  Procedure Laterality Date   CESAREAN SECTION     CESAREAN SECTION WITH BILATERAL TUBAL LIGATION N/A 03/09/2018   Procedure: CESAREAN SECTION WITH BILATERAL TUBAL LIGATION;  Surgeon: Atlas Blank, DO;  Location: [facility];  Service: Obstetrics;  Laterality: N/A;   HERNIA REPAIR     TONSILLECTOMY      Family History  Problem Relation Age of Onset   Diabetes Father    Hypertension Father    Diabetes Paternal Uncle    Hypertension Paternal Uncle    Diabetes Paternal Grandfather    Hypertension Paternal Grandfather     Social History   Socioeconomic History   Marital status: Single    Spouse name: Not on file   Number of children: Not on file   Years of education: Not on file   Highest education level: Not on file  Occupational History   Not on file  Tobacco Use   Smoking status: Never   Smokeless tobacco: Never  Vaping Use   Vaping status: Never Used  Substance and Sexual Activity   Alcohol  use: Yes    Comment: occasionally    Drug use: No   Sexual activity: Not on file  Other Topics Concern   Not on file  Social History Narrative   Not on file   Social Drivers of Health   Financial Resource Strain: Low Risk  (05/31/2023)   Overall Financial Resource Strain (CARDIA)    Difficulty of Paying Living Expenses: Not hard at all  Food Insecurity: No Food Insecurity (05/31/2023)   Hunger Vital Sign    Worried About Running Out of Food in the Last Year: Never true    Ran Out of Food in the Last Year: Never true  Transportation Needs: No Transportation Needs (05/31/2023)   PRAPARE - Administrator, Civil Service (Medical): No    Lack of Transportation (Non-Medical): No  Physical Activity: Inactive (05/31/2023)   Exercise Vital Sign    Days of Exercise per Week: 0 days    Minutes of Exercise per Session: 0 min  Stress: No Stress Concern Present (05/31/2023)   [employer] of Occupational Health - Occupational Stress Questionnaire    Feeling of Stress : Only a little  Social Connections: Moderately Integrated (05/31/2023)   Social Connection and Isolation Panel [NHANES]    Frequency of Communication with Friends and Family: Twice a week    Frequency of Social Gatherings with Friends and Family: More than three times a week  Attends Religious Services: 1 to 4 times per year    Active Member of Clubs or Organizations: No    Attends Banker Meetings: Never    Marital Status: Living with partner  Intimate Partner Violence: Not At Risk (05/31/2023)   Humiliation, Afraid, Rape, and Kick questionnaire    Fear of Current or Ex-Partner: No    Emotionally Abused: No    Physically Abused: No    Sexually Abused: No    Review of Systems  All other systems reviewed and are negative.       Objective    BP 125/85   Pulse 61   Temp 98.1 F (36.7 C) (Oral)   Resp 16   Ht 5\' 9"  (1.753 m)   Wt 265 lb (120.2 kg)   SpO2 98%   BMI 39.13 kg/m    Physical Exam Vitals and nursing note reviewed.  Constitutional:      General: She is not in acute distress.    Appearance: She is obese.  Cardiovascular:     Rate and Rhythm: Normal rate and regular rhythm.  Pulmonary:     Effort: Pulmonary effort is normal.     Breath sounds: Normal breath sounds.  Abdominal:     Palpations: Abdomen is soft.     Tenderness: There is no abdominal tenderness.  Neurological:     General: No focal deficit present.     Mental Status: She is alert and oriented to person, place, and time.         Assessment & Plan:   Encounter for weight management  Class 2 obesity due to excess calories without serious comorbidity with body mass index (BMI) of 39.0 to 39.9 in adult  Other orders -     Phentermine  HCl; Take 1 tablet (37.5 mg total) by mouth daily before breakfast.  Dispense: 30 tablet; Refill: 0     Return in about 4 weeks (around 08/11/2023) for follow up.   Arlo Lama, MD

## 2023-08-05 ENCOUNTER — Ambulatory Visit: Admitting: Family Medicine

## 2023-09-01 ENCOUNTER — Ambulatory Visit: Payer: Self-pay | Admitting: Family Medicine

## 2023-10-10 ENCOUNTER — Ambulatory Visit: Admitting: Family Medicine

## 2023-10-11 ENCOUNTER — Telehealth: Payer: Self-pay | Admitting: Family

## 2023-10-11 ENCOUNTER — Encounter: Payer: Self-pay | Admitting: Family

## 2023-10-11 ENCOUNTER — Telehealth (INDEPENDENT_AMBULATORY_CARE_PROVIDER_SITE_OTHER): Admitting: Family

## 2023-10-11 DIAGNOSIS — E669 Obesity, unspecified: Secondary | ICD-10-CM | POA: Diagnosis not present

## 2023-10-11 DIAGNOSIS — Z7689 Persons encountering health services in other specified circumstances: Secondary | ICD-10-CM

## 2023-10-11 NOTE — Progress Notes (Signed)
 Virtual Visit via Video Note  I connected with Carla Walker, on 10/11/2023 at 11:00 AM by video and verified that I am speaking with the correct person using two identifiers.  Consent: I discussed the limitations, risks, security and privacy concerns of performing an evaluation and management service by video and the availability of in person appointments. I also discussed with the patient that there may be a patient responsible charge related to this service. The patient expressed understanding and agreed to proceed.   Location of Patient: Home  Location of Provider: [facility]   Persons participating in Telemedicine visit: Carla Walker Greig Drones, NP Purvis Pepper, CMA   History of Present Illness: Carla Walker is a 33 y.o. female who presents for weight management. Patient states Phentermine  37.5 mg causing side effects of jitters and overall she does not feel well on the medication. Patient states previously she asked her primary provider Raguel Blush, MD to prescribe Lomaira . Patient states Raguel Blush, MD made medical decision for patient to trial Phentermine  37.5 mg first instead of prescribing Lomaira . Also, patient states in the past she was prescribed Lomaira  from Bariatric Surgery and feels Lomaira  works better than Phentermine . Patient states she would like for Lomaira  to be prescribed on today.  Past Medical History:  Diagnosis Date   Anemia    No Known Allergies  Current Outpatient Medications on File Prior to Visit  Medication Sig Dispense Refill   phentermine  (ADIPEX-P ) 37.5 MG tablet Take 1 tablet (37.5 mg total) by mouth daily before breakfast. 30 tablet 0   No current facility-administered medications on file prior to visit.    Observations/Objective: Alert and oriented x 3. Not in acute distress. Physical examination not completed as this is a telemedicine visit.  Assessment and Plan: 1. Encounter for  weight management (Primary) - I discussed with patient in detail I will consult with her primary provider Raguel Blush, MD for advisement on prescribing Lomaira . Patient verbalized understanding/agreement.   Follow Up Instructions: Follow-up with primary provider as scheduled.   Patient was given clear instructions to go to Emergency Department or return to medical center if symptoms don't improve, worsen, or new problems develop.The patient verbalized understanding.  I discussed the assessment and treatment plan with the patient. The patient was provided an opportunity to ask questions and all were answered. The patient agreed with the plan and demonstrated an understanding of the instructions.   The patient was advised to call back or seek an in-person evaluation if the symptoms worsen or if the condition fails to improve as anticipated.     I provided 5 minutes total of non-face-to-face time during this encounter.   Greig JINNY Drones, NP  [facility] at [facility] [place], [place] [phone] 10/11/2023, 10:53 AM

## 2023-10-12 NOTE — Telephone Encounter (Signed)
 I have attempted without success to contact this patient by phone to return their call.I was not able to leave message for patient due to their phone

## 2023-10-12 NOTE — Telephone Encounter (Signed)
 Patient called back and is aware of Amy,NP message.

## 2023-10-12 NOTE — Telephone Encounter (Signed)
 I have attempted without success to contact this patient by phone to return their call.

## 2023-10-13 NOTE — Telephone Encounter (Signed)
 I spoke with patient and made them aware of their recommendations per PCP.  Patient verbalized understanding

## 2024-01-03 ENCOUNTER — Ambulatory Visit (HOSPITAL_BASED_OUTPATIENT_CLINIC_OR_DEPARTMENT_OTHER): Payer: Self-pay

## 2024-01-04 ENCOUNTER — Encounter: Payer: Self-pay | Admitting: Emergency Medicine

## 2024-01-04 ENCOUNTER — Ambulatory Visit
Admission: EM | Admit: 2024-01-04 | Discharge: 2024-01-04 | Disposition: A | Discharge status: Home / Self Care | Attending: Family Medicine | Admitting: Family Medicine

## 2024-01-04 DIAGNOSIS — R070 Pain in throat: Secondary | ICD-10-CM | POA: Diagnosis not present

## 2024-01-04 DIAGNOSIS — J069 Acute upper respiratory infection, unspecified: Secondary | ICD-10-CM | POA: Insufficient documentation

## 2024-01-04 LAB — POCT RAPID STREP A (OFFICE): Rapid Strep A Screen: NEGATIVE

## 2024-01-04 LAB — POC SOFIA SARS ANTIGEN FIA: SARS Coronavirus 2 Ag: NEGATIVE

## 2024-01-04 LAB — POCT MONO SCREEN (KUC): Mono, POC: NEGATIVE

## 2024-01-04 MED ORDER — KETOROLAC TROMETHAMINE 30 MG/ML IJ SOLN
30.0000 mg | Freq: Once | INTRAMUSCULAR | Status: AC
  Administered 2024-01-04: 30 mg via INTRAMUSCULAR

## 2024-01-04 MED ORDER — KETOROLAC TROMETHAMINE 10 MG PO TABS: 10.0000 mg | ORAL_TABLET | Freq: Four times a day (QID) | ORAL | 0 refills | Status: AC | PRN

## 2024-01-04 MED ORDER — PROMETHAZINE-DM 6.25-15 MG/5ML PO SYRP: 5.0000 mL | ORAL_SOLUTION | Freq: Four times a day (QID) | ORAL | 0 refills | Status: AC | PRN

## 2024-01-04 NOTE — ED Triage Notes (Addendum)
 Pt states symptoms of severe sore throat starting Sunday night, 10/19. Pt states fever, and body aches have resided but persistent sore throat to the point of tears with sporadic coughing. OTC tylenol , Advil , Nyquil/dayquil, severe. Pt states son had similar/same symptoms for two weeks prior and spread to her. No nasal congestion or stuff nose reported. Pt reports 101 temp Monday afternoon. Pt has to take time swallowing. Triage completed by Zackery Pizza, Student RN.

## 2024-01-04 NOTE — Discharge Instructions (Addendum)
 The tests for COVID and for mono were negative.  Your strep test is negative.  Culture of the throat will be sent, and staff will notify you if that is in turn positive.  You have been given a shot of Toradol  30 mg today.  Ketorolac  10 mg tablets--take 1 tablet every 6 hours as needed for pain.  This is the same medicine that is in the shot we just gave you  Take Phenergan  with dextromethorphan syrup--5 mL or 1 teaspoon every 6 hours as needed for cough

## 2024-01-04 NOTE — ED Provider Notes (Signed)
 [facility]    CSN: [id number] Arrival date & time: 01/04/24  1445      History   Chief Complaint No chief complaint on file.   HPI Carla Walker is a 33 y.o. female.   HPI Here for sore throat that is been severe that began on October 19.  She had fever as high as 101 measured on October 19 and 20.  She has also had some myalgia and some sneezing.  She has had occasional coughing which makes the sore throat hurt worse.  No nausea vomiting or diarrhea.  Her 47-year-old had had sore throat for 2 or 3 days about 2 weeks ago, but uncertain if related.  He does now have some cold symptoms that began about 3 days ago.  NKDA  Last menstrual cycle October 6.  Past Medical History:  Diagnosis Date   Anemia     Patient Active Problem List   Diagnosis Date Noted   Cesarean delivery delivered 03/09/2018   Encounter for maternal care for low transverse scar from repeat cesarean delivery 03/09/2018    Past Surgical History:  Procedure Laterality Date   CESAREAN SECTION     CESAREAN SECTION WITH BILATERAL TUBAL LIGATION N/A 03/09/2018   Procedure: CESAREAN SECTION WITH BILATERAL TUBAL LIGATION;  Surgeon: Lilton Legions, DO;  Location: [facility];  Service: Obstetrics;  Laterality: N/A;   HERNIA REPAIR     TONSILLECTOMY      OB History     Gravida  5   Para  3   Term  3   Preterm      AB  2   Living  3      SAB      IAB  2   Ectopic      Multiple  0   Live Births  3            Home Medications    Prior to Admission medications   Medication Sig Start Date End Date Taking? Authorizing Provider  ketorolac  (TORADOL ) 10 MG tablet Take 1 tablet (10 mg total) by mouth every 6 (six) hours as needed (pain). 01/04/24  Yes Vonna Sharlet POUR, MD  promethazine -dextromethorphan (PROMETHAZINE -DM) 6.25-15 MG/5ML syrup Take 5 mLs by mouth 4 (four) times daily as needed for cough. 01/04/24  Yes Vonna Sharlet POUR, MD  phentermine   (ADIPEX-P ) 37.5 MG tablet Take 1 tablet (37.5 mg total) by mouth daily before breakfast. Patient not taking: Reported on 01/04/2024 07/14/23   Tanda Bleacher, MD    Family History Family History  Problem Relation Age of Onset   Diabetes Father    Hypertension Father    Diabetes Paternal Uncle    Hypertension Paternal Uncle    Diabetes Paternal Grandfather    Hypertension Paternal Grandfather     Social History Social History   Tobacco Use   Smoking status: Never   Smokeless tobacco: Never  Vaping Use   Vaping status: Never Used  Substance Use Topics   Alcohol use: Yes    Comment: occasionally    Drug use: No     Allergies   Patient has no known allergies.   Review of Systems Review of Systems   Physical Exam Triage Vital Signs ED Triage Vitals  Encounter Vitals Group     BP 01/04/24 1508 112/75     Girls Systolic BP Percentile --      Girls Diastolic BP Percentile --      Boys Systolic BP Percentile --  Boys Diastolic BP Percentile --      Pulse Rate 01/04/24 1508 96     Resp 01/04/24 1508 20     Temp 01/04/24 1508 97.8 F (36.6 C)     Temp Source 01/04/24 1508 Oral     SpO2 01/04/24 1508 98 %     Weight --      Height --      Head Circumference --      Peak Flow --      Pain Score 01/04/24 1509 10     Pain Loc --      Pain Education --      Exclude from Growth Chart --    No data found.  Updated Vital Signs BP 112/75 (BP Location: Left Arm)   Pulse 96   Temp 97.8 F (36.6 C) (Oral)   Resp 20   LMP 12/19/2023 (Approximate)   SpO2 98%   Visual Acuity Right Eye Distance:   Left Eye Distance:   Bilateral Distance:    Right Eye Near:   Left Eye Near:    Bilateral Near:     Physical Exam Vitals reviewed.  Constitutional:      General: She is not in acute distress.    Appearance: She is not ill-appearing, toxic-appearing or diaphoretic.  HENT:     Right Ear: Tympanic membrane and ear canal normal.     Left Ear: Tympanic membrane and  ear canal normal.     Nose: Congestion present.     Mouth/Throat:     Mouth: Mucous membranes are moist.     Comments: There is erythema of the tonsillar pillars, and there is clear mucus draining. Eyes:     Extraocular Movements: Extraocular movements intact.     Conjunctiva/sclera: Conjunctivae normal.     Pupils: Pupils are equal, round, and reactive to light.  Cardiovascular:     Rate and Rhythm: Normal rate and regular rhythm.     Heart sounds: No murmur heard. Pulmonary:     Effort: Pulmonary effort is normal. No respiratory distress.     Breath sounds: No stridor. No wheezing, rhonchi or rales.  Musculoskeletal:     Cervical back: Neck supple.  Lymphadenopathy:     Cervical: No cervical adenopathy.  Skin:    Capillary Refill: Capillary refill takes less than 2 seconds.     Coloration: Skin is not jaundiced or pale.  Neurological:     General: No focal deficit present.     Mental Status: She is alert and oriented to person, place, and time.  Psychiatric:        Behavior: Behavior normal.      UC Treatments / Results  Labs (all labs ordered are listed, but only abnormal results are displayed) Labs Reviewed  POCT RAPID STREP A (OFFICE) - Normal  POCT MONO SCREEN ([facility]) - Normal  CULTURE, GROUP A STREP ([facility])  POC SOFIA SARS ANTIGEN FIA    EKG   Radiology No results found.  Procedures Procedures (including critical care time)  Medications Ordered in UC Medications  ketorolac  (TORADOL ) 30 MG/ML injection 30 mg (has no administration in time range)    Initial Impression / Assessment and Plan / UC Course  I have reviewed the triage vital signs and the nursing notes.  Pertinent labs & imaging results that were available during my care of the patient were reviewed by me and considered in my medical decision making (see chart for details).     Monotest and COVID  test are both negative.  Rapid strep is negative.  Throat culture is sent and we will notify and  treat protocol if that is positive  Toradol  is given here as an injection and Toradol  tablets are sent to the pharmacy along with Phenergan  dextromethorphan for her symptoms.  Work note provided Final Clinical Impressions(s) / UC Diagnoses   Final diagnoses:  Viral URI  Throat pain     Discharge Instructions      The tests for COVID and for mono were negative.  Your strep test is negative.  Culture of the throat will be sent, and staff will notify you if that is in turn positive.  You have been given a shot of Toradol  30 mg today.  Ketorolac  10 mg tablets--take 1 tablet every 6 hours as needed for pain.  This is the same medicine that is in the shot we just gave you  Take Phenergan  with dextromethorphan syrup--5 mL or 1 teaspoon every 6 hours as needed for cough      ED Prescriptions     Medication Sig Dispense Auth. Provider   promethazine -dextromethorphan (PROMETHAZINE -DM) 6.25-15 MG/5ML syrup Take 5 mLs by mouth 4 (four) times daily as needed for cough. 118 mL Vonna Sharlet POUR, MD   ketorolac  (TORADOL ) 10 MG tablet Take 1 tablet (10 mg total) by mouth every 6 (six) hours as needed (pain). 20 tablet Renee Erb K, MD      PDMP not reviewed this encounter.   Vonna Sharlet POUR, MD 01/04/24 [phone]

## 2024-01-07 LAB — CULTURE, GROUP A STREP (THRC)

## 2024-01-09 ENCOUNTER — Ambulatory Visit (HOSPITAL_COMMUNITY): Payer: Self-pay
